# Patient Record
Sex: Male | Born: 1985 | Race: White | Hispanic: No | Smoking: Never smoker
Health system: Southern US, Community
[De-identification: ages and names within clinical notes are randomized; demographics above are authoritative.]

## PROBLEM LIST (undated history)

## (undated) HISTORY — PX: TYMPANOPLASTY: SHX33

---

## 2011-10-09 ENCOUNTER — Ambulatory Visit (INDEPENDENT_AMBULATORY_CARE_PROVIDER_SITE_OTHER): Payer: BC Managed Care – PPO | Admitting: Family Medicine

## 2011-10-09 VITALS — BP 132/90 | HR 108 | Temp 98.3°F | Resp 16 | Ht 69.0 in | Wt 224.0 lb

## 2011-10-09 DIAGNOSIS — R109 Unspecified abdominal pain: Secondary | ICD-10-CM

## 2011-10-09 LAB — POCT UA - MICROSCOPIC ONLY
Bacteria, U Microscopic: NEGATIVE
Casts, Ur, LPF, POC: NEGATIVE
Crystals, Ur, HPF, POC: NEGATIVE
Epithelial cells, urine per micros: NEGATIVE
Mucus, UA: NEGATIVE
RBC, urine, microscopic: NEGATIVE
WBC, Ur, HPF, POC: NEGATIVE
Yeast, UA: NEGATIVE

## 2011-10-09 LAB — POCT URINALYSIS DIPSTICK
Bilirubin, UA: NEGATIVE
Blood, UA: NEGATIVE
Glucose, UA: NEGATIVE
Ketones, UA: NEGATIVE
Leukocytes, UA: NEGATIVE
Nitrite, UA: NEGATIVE
Protein, UA: NEGATIVE
Spec Grav, UA: 1.025
Urobilinogen, UA: 0.2
pH, UA: 6

## 2011-10-09 NOTE — Progress Notes (Signed)
26 yo [employer] with acute right flank pain today, intermittent, starting about 3 pm.  Non-positional and not related to activity.  Longest duration is several minutes. No nausea or vomiting. No past hx of this.  O:  Alert, NAD, obese Denies pain now Abd:  Neg for tenderness, HSM, guarding, rebound, or mass No CVAT  Results for orders placed in visit on 10/09/11  POCT URINALYSIS DIPSTICK      Component Value Range   Color, UA yellow     Clarity, UA clear     Glucose, UA neg     Bilirubin, UA neg     Ketones, UA neg     Spec Grav, UA 1.025     Blood, UA neg     pH, UA 6.0     Protein, UA neg     Urobilinogen, UA 0.2     Nitrite, UA neg     Leukocytes, UA Negative    POCT UA - MICROSCOPIC ONLY      Component Value Range   WBC, Ur, HPF, POC neg     RBC, urine, microscopic neg     Bacteria, U Microscopic neg     Mucus, UA neg     Epithelial cells, urine per micros neg     Crystals, Ur, HPF, POC neg     Casts, Ur, LPF, POC neg     Yeast, UA neg     A:  Bowel related spasms are likely  P: probiotic, colace for now;  Take daily;  Improve diet Call if symptoms persist

## 2012-07-22 ENCOUNTER — Ambulatory Visit (INDEPENDENT_AMBULATORY_CARE_PROVIDER_SITE_OTHER): Payer: BC Managed Care – PPO | Admitting: Family Medicine

## 2012-07-22 DIAGNOSIS — Z043 Encounter for examination and observation following other accident: Secondary | ICD-10-CM

## 2012-07-22 NOTE — Patient Instructions (Addendum)
Take ibuprofen 800 mg (4 x 200mg  ) every 8 hours as needed for pain and inflammation.  Recheck if further concerns arise.
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
## 2015-04-14 ENCOUNTER — Inpatient Hospital Stay (HOSPITAL_COMMUNITY)
Admission: EM | Admit: 2015-04-14 | Discharge: 2015-04-19 | DRG: 871 | Disposition: A | Discharge status: Home / Self Care | Payer: BLUE CROSS/BLUE SHIELD | Attending: Internal Medicine | Admitting: Internal Medicine

## 2015-04-14 ENCOUNTER — Emergency Department (HOSPITAL_COMMUNITY): Payer: BLUE CROSS/BLUE SHIELD

## 2015-04-14 ENCOUNTER — Encounter (HOSPITAL_COMMUNITY): Payer: Self-pay | Admitting: Emergency Medicine

## 2015-04-14 DIAGNOSIS — R079 Chest pain, unspecified: Secondary | ICD-10-CM | POA: Diagnosis not present

## 2015-04-14 DIAGNOSIS — R17 Unspecified jaundice: Secondary | ICD-10-CM | POA: Diagnosis present

## 2015-04-14 DIAGNOSIS — Z6834 Body mass index (BMI) 34.0-34.9, adult: Secondary | ICD-10-CM | POA: Diagnosis not present

## 2015-04-14 DIAGNOSIS — D72829 Elevated white blood cell count, unspecified: Secondary | ICD-10-CM | POA: Diagnosis not present

## 2015-04-14 DIAGNOSIS — J189 Pneumonia, unspecified organism: Secondary | ICD-10-CM | POA: Diagnosis present

## 2015-04-14 DIAGNOSIS — R0902 Hypoxemia: Secondary | ICD-10-CM | POA: Diagnosis not present

## 2015-04-14 DIAGNOSIS — A419 Sepsis, unspecified organism: Principal | ICD-10-CM | POA: Diagnosis present

## 2015-04-14 DIAGNOSIS — J948 Other specified pleural conditions: Secondary | ICD-10-CM | POA: Diagnosis not present

## 2015-04-14 DIAGNOSIS — E669 Obesity, unspecified: Secondary | ICD-10-CM | POA: Diagnosis present

## 2015-04-14 DIAGNOSIS — J9601 Acute respiratory failure with hypoxia: Secondary | ICD-10-CM | POA: Diagnosis present

## 2015-04-14 DIAGNOSIS — R1011 Right upper quadrant pain: Secondary | ICD-10-CM | POA: Diagnosis present

## 2015-04-14 DIAGNOSIS — J918 Pleural effusion in other conditions classified elsewhere: Secondary | ICD-10-CM | POA: Diagnosis present

## 2015-04-14 DIAGNOSIS — R0781 Pleurodynia: Secondary | ICD-10-CM | POA: Diagnosis present

## 2015-04-14 DIAGNOSIS — J90 Pleural effusion, not elsewhere classified: Secondary | ICD-10-CM | POA: Diagnosis not present

## 2015-04-14 DIAGNOSIS — R000 Tachycardia, unspecified: Secondary | ICD-10-CM | POA: Diagnosis not present

## 2015-04-14 DIAGNOSIS — Z9889 Other specified postprocedural states: Secondary | ICD-10-CM

## 2015-04-14 DIAGNOSIS — R0600 Dyspnea, unspecified: Secondary | ICD-10-CM | POA: Diagnosis not present

## 2015-04-14 DIAGNOSIS — R0602 Shortness of breath: Secondary | ICD-10-CM

## 2015-04-14 LAB — COMPREHENSIVE METABOLIC PANEL
ALBUMIN: 4.8 g/dL (ref 3.5–5.0)
ALT: 27 U/L (ref 17–63)
AST: 20 U/L (ref 15–41)
Alkaline Phosphatase: 81 U/L (ref 38–126)
Anion gap: 11 (ref 5–15)
BILIRUBIN TOTAL: 2.5 mg/dL — AB (ref 0.3–1.2)
BUN: 14 mg/dL (ref 6–20)
CHLORIDE: 102 mmol/L (ref 101–111)
CO2: 26 mmol/L (ref 22–32)
Calcium: 9.7 mg/dL (ref 8.9–10.3)
Creatinine, Ser: 0.62 mg/dL (ref 0.61–1.24)
GFR calc Af Amer: >60 mL/min (ref >60)
GFR calc non Af Amer: >60 mL/min (ref >60)
GLUCOSE: 110 mg/dL — AB (ref 65–99)
POTASSIUM: 4 mmol/L (ref 3.5–5.1)
Sodium: 139 mmol/L (ref 135–145)
Total Protein: 8.6 g/dL — ABNORMAL HIGH (ref 6.5–8.1)

## 2015-04-14 LAB — CBC
HEMATOCRIT: 43 % (ref 39.0–52.0)
Hemoglobin: 15.2 g/dL (ref 13.0–17.0)
MCH: 30.9 pg (ref 26.0–34.0)
MCHC: 35.3 g/dL (ref 30.0–36.0)
MCV: 87.4 fL (ref 78.0–100.0)
Platelets: 249 10*3/uL (ref 150–400)
RBC: 4.92 MIL/uL (ref 4.22–5.81)
RDW: 12.1 % (ref 11.5–15.5)
WBC: 16.3 10*3/uL — ABNORMAL HIGH (ref 4.0–10.5)

## 2015-04-14 LAB — PROTIME-INR
INR: 1.11 (ref 0.00–1.49)
Prothrombin Time: 14.5 seconds (ref 11.6–15.2)

## 2015-04-14 LAB — PROCALCITONIN: Procalcitonin: <0.1 ng/mL

## 2015-04-14 LAB — LACTIC ACID, PLASMA: Lactic Acid, Venous: 1 mmol/L (ref 0.5–2.0)

## 2015-04-14 LAB — I-STAT CG4 LACTIC ACID, ED: LACTIC ACID, VENOUS: 1.16 mmol/L (ref 0.5–2.0)

## 2015-04-14 LAB — LIPASE, BLOOD: Lipase: 24 U/L (ref 11–51)

## 2015-04-14 LAB — MRSA PCR SCREENING: MRSA BY PCR: NEGATIVE

## 2015-04-14 LAB — APTT: APTT: 32 s (ref 24–37)

## 2015-04-14 MED ORDER — HYDROMORPHONE HCL 1 MG/ML IJ SOLN
1.0000 mg | Freq: Once | INTRAMUSCULAR | Status: AC
  Administered 2015-04-14: 1 mg via INTRAVENOUS
  Filled 2015-04-14: qty 1

## 2015-04-14 MED ORDER — HYDROMORPHONE HCL 1 MG/ML IJ SOLN
0.5000 mg | INTRAMUSCULAR | Status: DC | PRN
  Administered 2015-04-14 – 2015-04-15 (×7): 1 mg via INTRAVENOUS
  Administered 2015-04-15: 0.5 mg via INTRAVENOUS
  Filled 2015-04-14 (×8): qty 1

## 2015-04-14 MED ORDER — SODIUM CHLORIDE 0.9 % IV BOLUS (SEPSIS): 1000.0000 mL | Freq: Once | INTRAVENOUS | Status: DC

## 2015-04-14 MED ORDER — IOHEXOL 350 MG/ML SOLN
100.0000 mL | Freq: Once | INTRAVENOUS | Status: AC | PRN
Start: 2015-04-14 — End: 2015-04-14
  Administered 2015-04-14: 100 mL via INTRAVENOUS

## 2015-04-14 MED ORDER — SODIUM CHLORIDE 0.9 % IV SOLN
INTRAVENOUS | Status: DC
  Administered 2015-04-14 – 2015-04-16 (×5): via INTRAVENOUS

## 2015-04-14 MED ORDER — ACETAMINOPHEN 650 MG RE SUPP: 650.0000 mg | Freq: Four times a day (QID) | RECTAL | Status: DC | PRN

## 2015-04-14 MED ORDER — SODIUM CHLORIDE 0.9 % IV BOLUS (SEPSIS)
1000.0000 mL | Freq: Once | INTRAVENOUS | Status: AC
  Administered 2015-04-14: 1000 mL via INTRAVENOUS

## 2015-04-14 MED ORDER — SODIUM CHLORIDE 0.9 % IV SOLN: INTRAVENOUS | Status: DC

## 2015-04-14 MED ORDER — ALUM & MAG HYDROXIDE-SIMETH 200-200-20 MG/5ML PO SUSP: 30.0000 mL | Freq: Four times a day (QID) | ORAL | Status: DC | PRN

## 2015-04-14 MED ORDER — CEFTRIAXONE SODIUM 1 G IJ SOLR
1.0000 g | Freq: Once | INTRAMUSCULAR | Status: AC
  Administered 2015-04-14: 1 g via INTRAVENOUS
  Filled 2015-04-14: qty 10

## 2015-04-14 MED ORDER — ACETAMINOPHEN 325 MG PO TABS
650.0000 mg | ORAL_TABLET | Freq: Four times a day (QID) | ORAL | Status: DC | PRN
  Administered 2015-04-16: 650 mg via ORAL
  Filled 2015-04-14: qty 2

## 2015-04-14 MED ORDER — ENOXAPARIN SODIUM 100 MG/ML ~~LOC~~ SOLN
100.0000 mg | SUBCUTANEOUS | Status: AC
  Administered 2015-04-14: 100 mg via SUBCUTANEOUS
  Filled 2015-04-14: qty 1

## 2015-04-14 MED ORDER — DEXTROSE 5 % IV SOLN
500.0000 mg | Freq: Once | INTRAVENOUS | Status: AC
  Administered 2015-04-14: 500 mg via INTRAVENOUS
  Filled 2015-04-14: qty 500

## 2015-04-14 MED ORDER — ONDANSETRON HCL 4 MG/2ML IJ SOLN: 4.0000 mg | Freq: Four times a day (QID) | INTRAMUSCULAR | Status: DC | PRN

## 2015-04-14 MED ORDER — ONDANSETRON HCL 4 MG PO TABS: 4.0000 mg | ORAL_TABLET | Freq: Four times a day (QID) | ORAL | Status: DC | PRN

## 2015-04-14 MED ORDER — ENOXAPARIN SODIUM 100 MG/ML ~~LOC~~ SOLN
100.0000 mg | Freq: Two times a day (BID) | SUBCUTANEOUS | Status: DC
  Administered 2015-04-15 – 2015-04-17 (×5): 100 mg via SUBCUTANEOUS
  Filled 2015-04-14 (×6): qty 1

## 2015-04-14 MED ORDER — CEFTRIAXONE SODIUM 1 G IJ SOLR
1.0000 g | Freq: Every day | INTRAMUSCULAR | Status: DC
  Administered 2015-04-15 – 2015-04-16 (×2): 1 g via INTRAVENOUS
  Filled 2015-04-14 (×2): qty 10

## 2015-04-14 MED ORDER — OXYCODONE HCL 5 MG PO TABS
5.0000 mg | ORAL_TABLET | ORAL | Status: DC | PRN
  Administered 2015-04-14 – 2015-04-16 (×6): 5 mg via ORAL
  Filled 2015-04-14 (×6): qty 1

## 2015-04-14 MED ORDER — DEXTROSE 5 % IV SOLN
500.0000 mg | Freq: Every day | INTRAVENOUS | Status: DC
  Administered 2015-04-15 – 2015-04-18 (×4): 500 mg via INTRAVENOUS
  Filled 2015-04-14 (×4): qty 500

## 2015-04-14 MED ORDER — INFLUENZA VAC SPLIT QUAD 0.5 ML IM SUSY
0.5000 mL | PREFILLED_SYRINGE | INTRAMUSCULAR | Status: AC
  Administered 2015-04-19: 0.5 mL via INTRAMUSCULAR
  Filled 2015-04-14 (×3): qty 0.5

## 2015-04-14 NOTE — ED Notes (Signed)
Bed: WA13 Expected date:  Expected time:  Means of arrival:  Comments: Hold for Res B 

## 2015-04-14 NOTE — Progress Notes (Signed)
ANTIBIOTIC CONSULT NOTE - INITIAL  Pharmacy Consult for Azithromycin / Ceftriaxone Indication: Sepsis 2/2 CAP  No Known Allergies  Patient Measurements:   Adjusted Body Weight:   Vital Signs: Temp: 100.9 F (38.3 C) (11/19 1839) Temp Source: Oral (11/19 1839) BP: 120/64 mmHg (11/19 2005) Pulse Rate: 120 (11/19 2005) Intake/Output from previous day:   Intake/Output from this shift:    Labs:  Recent Labs  04/14/15 1646  WBC 16.3*  HGB 15.2  PLT 249  CREATININE 0.62   CrCl cannot be calculated (Unknown ideal weight.). No results for input(s): VANCOTROUGH, VANCOPEAK, VANCORANDOM, GENTTROUGH, GENTPEAK, GENTRANDOM, TOBRATROUGH, TOBRAPEAK, TOBRARND, AMIKACINPEAK, AMIKACINTROU, AMIKACIN in the last 72 hours.   Microbiology: No results found for this or any previous visit (from the past 720 hour(s)).  Medical History: History reviewed. No pertinent past medical history.  Assessment: [age] yoM admitted with tachycardia and right sided chest and abd pain. CTA had indeterminate results for possible PE and showed RLL airspace disease which may reflect atelectasis vs PNA.   Pharmacy consulted to start Azithromycin and Ceftriaxone for CAP.  No antibiotic allergies listed.  First doses given in ED.    Goal of Therapy:  Eradication of infection  Plan:  Continue Ceftriaxone 1g IV q24h and azithromycin 500mg  IV q24h Need for further dosage adjustment appears unlikely at present.    Will sign off at this time.  Please reconsult if a change in clinical status warrants re-evaluation of dosage.  Haynes Hoehnolleen Zahirah Cheslock, PharmD, BCPS 04/14/2015, 8:41 PM  Pager: [phone]

## 2015-04-14 NOTE — ED Notes (Signed)
Patient was given urinal, unable to go at this time. Has urinal at bedside and will try again.

## 2015-04-14 NOTE — H&P (Signed)
[facility] Admission History and Physical       Perrin Gens ZOX:[id number] DOB: [birth year] DOA: 04/14/2015  Referring physician: EDP PCP: Martha Clan, MD  Specialists:   Chief Complaint: Right Chest and RUQ Pain  HPI: Ryan Leach is a 29 y.o. male previously healthy until 4 days ago, he began to have fevers chills and Right Sided Chest Pain and RUQ Pain worse with a deep breath.   He also reports Cough and SOB, but denies any Nausea or Vomiting or Diarrhea.   He was evaluated in the ED and found to have Tachycardia, and a CTA of the Chest  Was performed and had indeterminate results for Possible PE however it did reveal a RLL infiltrate and he was started on IV Antibiotics for CAP Pneumonia.   A Sepsi Workup has also been inititated   Review of Systems:  Constitutional: No Weight Loss, No Weight Gain, Night Sweats, +Fevers, +Chills, Dizziness, Light Headedness, Fatigue, or Generalized Weakness HEENT: No Headaches, Difficulty Swallowing,Tooth/Dental Problems,Sore Throat,  No Sneezing, Rhinitis, Ear Ache, Nasal Congestion, or Post Nasal Drip,  Cardio-vascular:  +Chest pain, Orthopnea, PND, Edema in Lower Extremities, Anasarca, Dizziness, Palpitations  Resp: No Dyspnea, No DOE, No Productive Cough, +Non-Productive Cough, No Hemoptysis, No Wheezing.    GI: No Heartburn, Indigestion, Abdominal Pain, Nausea, Vomiting, Diarrhea, Constipation, Hematemesis, Hematochezia, Melena, Change in Bowel Habits,  Loss of Appetite  GU: No Dysuria, No Change in Color of Urine, No Urgency or Urinary Frequency, No Flank pain.  Musculoskeletal: No Joint Pain or Swelling, No Decreased Range of Motion, No Back Pain.  Neurologic: No Syncope, No Seizures, Muscle Weakness, Paresthesia, Vision Disturbance or Loss, No Diplopia, No Vertigo, No Difficulty Walking,  Skin: No Rash or Lesions. Psych: No Change in Mood or Affect, No Depression or Anxiety, No Memory loss, No Confusion, or  Hallucinations   History reviewed. No pertinent past medical history.   Past Surgical History  Procedure Laterality Date  . Tympanoplasty        Prior to Admission medications   Medication Sig Start Date End Date Taking? Authorizing Provider  Ibuprofen-Diphenhydramine HCl (ADVIL PM) 200-25 MG CAPS Take 2 capsules by mouth at bedtime as needed (pain.).   Yes Historical Provider, MD     No Known Allergies  Social History:  reports that he has never smoked. He does not have any smokeless tobacco history on file. He reports that he does not drink alcohol or use illicit drugs.    History reviewed. No pertinent family history.     Physical Exam:  GEN:  Pleasant Obese 29 y.o. Caucasian male examined and in no acute distress; cooperative with exam Filed Vitals:   04/14/15 1627 04/14/15 1754 04/14/15 1839 04/14/15 1842  BP: 132/91 138/88 130/84   Pulse: 141 131 132   Temp: 98.5 F (36.9 C)  100.9 F (38.3 C)   TempSrc: Oral  Oral   Resp: SpO2: 93% 93% 90% 95%   Blood pressure 130/84, pulse 132, temperature 100.9 F (38.3 C), temperature source Oral, resp. rate 30, SpO2 95 %. PSYCH: SHe is alert and oriented x4; does not appear anxious does not appear depressed; affect is normal HEENT: Normocephalic and Atraumatic, Mucous membranes pink; PERRLA; EOM intact; Fundi:  Benign;  No scleral icterus, Nares: Patent, Oropharynx: Clear, Fair Dentition,    Neck:  FROM, No Cervical Lymphadenopathy nor Thyromegaly or Carotid Bruit; No JVD; Breasts:: Not examined CHEST WALL: No tenderness CHEST: Normal respiration, clear to  auscultation bilaterally HEART: Regular rate and rhythm; no murmurs rubs or gallops BACK: No kyphosis or scoliosis; No CVA tenderness ABDOMEN: Positive Bowel Sounds, Obese, Soft Non-Tender, No Rebound or Guarding; No Masses, No Organomegaly. Rectal Exam: Not done EXTREMITIES: No Cyanosis, Clubbing, or Edema; No Ulcerations. Genitalia: not examined PULSES:  2+ and symmetric SKIN: Normal hydration no rash or ulceration CNS:  Alert and Oriented x 4, No Focal Deficits Vascular: pulses palpable throughout    Labs on Admission:  Basic Metabolic Panel:  Recent Labs Lab 04/14/15 1646  NA 139  K 4.0  CL 102  CO2 26  GLUCOSE 110*  BUN 14  CREATININE 0.62  CALCIUM 9.7   Liver Function Tests:  Recent Labs Lab 04/14/15 1646  AST 20  ALT 27  ALKPHOS 81  BILITOT 2.5*  PROT 8.6*  ALBUMIN 4.8    Recent Labs Lab 04/14/15 1646  LIPASE 24   No results for input(s): AMMONIA in the last 168 hours. CBC:  Recent Labs Lab 04/14/15 1646  WBC 16.3*  HGB 15.2  HCT 43.0  MCV 87.4  PLT 249   Cardiac Enzymes: No results for input(s): CKTOTAL, CKMB, CKMBINDEX, TROPONINI in the last 168 hours.  BNP (last 3 results) No results for input(s): BNP in the last 8760 hours.  ProBNP (last 3 results) No results for input(s): PROBNP in the last 8760 hours.  CBG: No results for input(s): GLUCAP in the last 168 hours.  Radiological Exams on Admission: Ct Angio Chest Pe W/cm &/or Wo Cm  04/14/2015  CLINICAL DATA:  Right-sided chest pain and abdominal pain EXAM: CT ANGIOGRAPHY CHEST WITH CONTRAST TECHNIQUE: Multidetector CT imaging of the chest was performed using the standard protocol during bolus administration of intravenous contrast. Multiplanar CT image reconstructions and MIPs were obtained to evaluate the vascular anatomy. CONTRAST:  OMNIPAQUE IOHEXOL 350 MG/ML SOLN COMPARISON:  None. FINDINGS: There is significant patient respiratory motion degrading image quality. The lobar and segmental pulmonary artery branches are are suboptimal for evaluation of pulmonary emboli. There is no central pulmonary embolus. The main pulmonary artery, right main pulmonary artery and left main pulmonary arteries are normal in size. The heart size is enlarged. There is no pericardial effusion. There is a small right pleural effusion. There is right lower  lobe airspace disease. There is mild right middle lobe atelectasis. There is no axillary, hilar, or mediastinal adenopathy. There is no lytic or blastic osseous lesion. The visualized portions of the upper abdomen are unremarkable. Review of the MIP images confirms the above findings. IMPRESSION: 1. Significant patient respiratory motion degrading image quality. The lobar and segmental pulmonary artery branches are are suboptimal for evaluation of pulmonary emboli. No central pulmonary embolus. 2. Small right pleural effusion with right lower lobe airspace disease which may reflect atelectasis versus pneumonia. Electronically Signed   By: Elige Ko   On: 04/14/2015 18:45   Dg Chest Portable 1 View  04/14/2015  CLINICAL DATA:  Pt complaining of RUQ abdominal pain on-going x 4 days. Worse with movement/pressure. "When I try to sleep it feels like I'm suffocating the pain is that bad." Denies N/V/D. Nonsmoker. EXAM: PORTABLE CHEST 1 VIEW COMPARISON:  None. FINDINGS: Mildly degraded exam due to AP portable technique and patient body habitus. Patient rotated to the left. Midline trachea. Cardiomegaly accentuated by AP portable technique. Possible small right pleural effusion. No pneumothorax. Extremely low lung volumes, accentuating the pulmonary interstitium. Suspect lower lobe predominant right sided airspace disease. Cannot exclude concurrent mild  pulmonary venous congestion. IMPRESSION: Suboptimal exam, secondary to patient size, AP portable technique, and likely minimal motion. Lower lobe predominant right-sided airspace disease is suspicious for infection. Atelectasis felt less likely. Cardiomegaly with suspicion of mild pulmonary venous congestion, superimposed upon extremely low lung volumes. Possible small right pleural effusion. Consider PA and lateral radiographs if possible. Electronically Signed   By: Jeronimo GreavesKyle  Talbot M.D.   On: 04/14/2015 17:15     EKG: Independently reviewed. Sinus Tachycardia rate=  135   Assessment/Plan:   29 y.o. male with  Active Problems:   1.    CAP (community acquired pneumonia)/ Early Sepsis   IV Rocephin   IV Azithromycin     2.    Sinus Tachycardia- due to #1, and Pain   IVFs   Pain Control PRN     3.    Pleuritic Chest Pain   CTA with Indeterminate results for PE    VQ Scan in AM     4.    RUQ ABD Pain    ABD US in AM   Monitor LFTs   Pain control PRN     5.    Leukocytosis- due to #1, and Stress Rxn   Monitor Trend      5.    DVT Prophylaxis   Full dose Lovenox until R/O by VQ scan    Code Status:     FULL CODE      Family Communication:   Parents  at Bedside     Disposition Plan:    Inpatient  Status        Time spent:   4970 Minutes      Ron ParkerJENKINS,Arvine Clayburn C [facility] Pager [phone]   If 7AM -7PM Please Contact the Day Rounding Team MD for [facility]  If 7PM-7AM, Please Contact Night-Floor Coverage  [email or web address] Password [email or web address] 04/14/2015, 7:47 PM     ADDENDUM:   Patient was seen and examined on 04/14/2015

## 2015-04-14 NOTE — ED Notes (Signed)
Dr. Rubin PayorPickering has been in and performed abd. U/s with our portable u/s machine.  We have just initiated IV access and sent blood for testing.

## 2015-04-14 NOTE — ED Notes (Signed)
Pt complaining of right sided abdominal pain on-going x 4 days. Worse with movement/pressure. "When I try to sleep it feels like I'm suffocating the pain is that bad." Denies N/V/D. Occasional fever/chills.

## 2015-04-14 NOTE — ED Provider Notes (Signed)
CSN: [id number]     Arrival date & time 04/14/15  1614 History   First MD Initiated Contact with Patient 04/14/15 1633     Chief Complaint  Patient presents with  . Tachycardia  . Abdominal Pain     (Consider location/radiation/quality/duration/timing/severity/associated sxs/prior Treatment) Patient is a 29 y.o. male presenting with abdominal pain. The history is provided by the patient.  Abdominal Pain  patient presents with right flank or right lower chest pain. It began 4 days ago. It is constant. Worse with lying down. Worse with certain movements. Not worse with eating. No dysuria. He has had some nausea. States he feels his heart going fast. No trauma. He has not had pains like this before. States it feels like he is suffocating when he lays back.  History reviewed. No pertinent past medical history. Past Surgical History  Procedure Laterality Date  . Tympanoplasty     History reviewed. No pertinent family history. Social History  Substance Use Topics  . Smoking status: Never Smoker   . Smokeless tobacco: None  . Alcohol Use: No    Review of Systems  Gastrointestinal: Positive for abdominal pain.      Allergies  Review of patient's allergies indicates no known allergies.  Home Medications   Prior to Admission medications   Medication Sig Start Date End Date Taking? Authorizing Provider  Ibuprofen-Diphenhydramine HCl (ADVIL PM) 200-25 MG CAPS Take 2 capsules by mouth at bedtime as needed (pain.).   Yes Historical Provider, MD   BP 120/64 mmHg  Pulse 120  Temp(Src) 100.9 F (38.3 C) (Oral)  Resp 24  Ht 5\' 9"  (1.753 m)  Wt 230 lb 6.1 oz (104.5 kg)  BMI 34.01 kg/m2  SpO2 96% Physical Exam  Constitutional: He appears well-developed.  HENT:  Head: Atraumatic.  Eyes: EOM are normal.  Neck: Neck supple.  Cardiovascular:  Tachycardia  Pulmonary/Chest: Effort normal.  Tachypnea  Abdominal: Soft. There is tenderness.  Right upper quadrant to right flank  tenderness. No rebound or guarding.  Musculoskeletal: Normal range of motion.  Neurological: He is alert.  Skin: Skin is warm.    ED Course  Procedures (including critical care time) Labs Review Labs Reviewed  COMPREHENSIVE METABOLIC PANEL - Abnormal; Notable for the following:    Glucose, Bld 110 (*)    Total Protein 8.6 (*)    Total Bilirubin 2.5 (*)    All other components within normal limits  CBC - Abnormal; Notable for the following:    WBC 16.3 (*)    All other components within normal limits  MRSA PCR SCREENING  CULTURE, BLOOD (ROUTINE X 2)  CULTURE, BLOOD (ROUTINE X 2)  LIPASE, BLOOD  PROTIME-INR  APTT  URINALYSIS, ROUTINE W REFLEX MICROSCOPIC (NOT [facility])  TSH  BASIC METABOLIC PANEL  CBC  LACTIC ACID, PLASMA  LACTIC ACID, PLASMA  PROCALCITONIN  I-STAT CG4 LACTIC ACID, ED    Imaging Review Ct Angio Chest Pe W/cm &/or Wo Cm  04/14/2015  CLINICAL DATA:  Right-sided chest pain and abdominal pain EXAM: CT ANGIOGRAPHY CHEST WITH CONTRAST TECHNIQUE: Multidetector CT imaging of the chest was performed using the standard protocol during bolus administration of intravenous contrast. Multiplanar CT image reconstructions and MIPs were obtained to evaluate the vascular anatomy. CONTRAST:  100mL OMNIPAQUE IOHEXOL 350 MG/ML SOLN COMPARISON:  None. FINDINGS: There is significant patient respiratory motion degrading image quality. The lobar and segmental pulmonary artery branches are are suboptimal for evaluation of pulmonary emboli. There is no central pulmonary  embolus. The main pulmonary artery, right main pulmonary artery and left main pulmonary arteries are normal in size. The heart size is enlarged. There is no pericardial effusion. There is a small right pleural effusion. There is right lower lobe airspace disease. There is mild right middle lobe atelectasis. There is no axillary, hilar, or mediastinal adenopathy. There is no lytic or blastic osseous lesion. The visualized  portions of the upper abdomen are unremarkable. Review of the MIP images confirms the above findings. IMPRESSION: 1. Significant patient respiratory motion degrading image quality. The lobar and segmental pulmonary artery branches are are suboptimal for evaluation of pulmonary emboli. No central pulmonary embolus. 2. Small right pleural effusion with right lower lobe airspace disease which may reflect atelectasis versus pneumonia. Electronically Signed   By: Elige Ko   On: 04/14/2015 18:45   Dg Chest Portable 1 View  04/14/2015  CLINICAL DATA:  Pt complaining of RUQ abdominal pain on-going x 4 days. Worse with movement/pressure. "When I try to sleep it feels like I'm suffocating the pain is that bad." Denies N/V/D. Nonsmoker. EXAM: PORTABLE CHEST 1 VIEW COMPARISON:  None. FINDINGS: Mildly degraded exam due to AP portable technique and patient body habitus. Patient rotated to the left. Midline trachea. Cardiomegaly accentuated by AP portable technique. Possible small right pleural effusion. No pneumothorax. Extremely low lung volumes, accentuating the pulmonary interstitium. Suspect lower lobe predominant right sided airspace disease. Cannot exclude concurrent mild pulmonary venous congestion. IMPRESSION: Suboptimal exam, secondary to patient size, AP portable technique, and likely minimal motion. Lower lobe predominant right-sided airspace disease is suspicious for infection. Atelectasis felt less likely. Cardiomegaly with suspicion of mild pulmonary venous congestion, superimposed upon extremely low lung volumes. Possible small right pleural effusion. Consider PA and lateral radiographs if possible. Electronically Signed   By: Jeronimo Greaves M.D.   On: 04/14/2015 17:15   I have personally reviewed and evaluated these images and lab results as part of my medical decision-making.   EKG Interpretation   Date/Time:  Saturday April 14 2015 16:39:11 EST Ventricular Rate:  135 PR Interval:  161 QRS  Duration: 89 QT Interval:  275 QTC Calculation: 412 R Axis:   130 Text Interpretation:  Sinus tachycardia Left posterior fascicular block  Baseline wander in lead(s) II V2 V3 V4 Confirmed by Rubin Payor  MD, Harrold Donath  [phone]) on 04/14/2015 10:51:51 PM      MDM   Final diagnoses:  CAP (community acquired pneumonia)    Patient with shortness of breath and chest pain. Has pneumonia on x-ray but worry for pulmonary embolism since has not been having fevers or coughing. CT scan done and was suboptimal for distal PE but no large central PEs. White count is elevated and he has developed low-grade fever. Will admit to stepdown bed. Has had IV Rocephin and azithromycin. Fluid boluses given.    Benjiman Core, MD 04/14/15 [phone]

## 2015-04-15 ENCOUNTER — Inpatient Hospital Stay (HOSPITAL_COMMUNITY): Payer: BLUE CROSS/BLUE SHIELD

## 2015-04-15 DIAGNOSIS — R1011 Right upper quadrant pain: Secondary | ICD-10-CM | POA: Insufficient documentation

## 2015-04-15 LAB — URINALYSIS, ROUTINE W REFLEX MICROSCOPIC
Bilirubin Urine: NEGATIVE
GLUCOSE, UA: NEGATIVE mg/dL
HGB URINE DIPSTICK: NEGATIVE
Ketones, ur: NEGATIVE mg/dL
LEUKOCYTES UA: NEGATIVE
Nitrite: NEGATIVE
PH: 5 (ref 5.0–8.0)
PROTEIN: NEGATIVE mg/dL

## 2015-04-15 LAB — BASIC METABOLIC PANEL
Anion gap: 7 (ref 5–15)
BUN: 15 mg/dL (ref 6–20)
CALCIUM: 8.4 mg/dL — AB (ref 8.9–10.3)
CO2: 25 mmol/L (ref 22–32)
CREATININE: 0.66 mg/dL (ref 0.61–1.24)
Chloride: 103 mmol/L (ref 101–111)
GFR calc Af Amer: >60 mL/min (ref >60)
GLUCOSE: 133 mg/dL — AB (ref 65–99)
POTASSIUM: 3.9 mmol/L (ref 3.5–5.1)
SODIUM: 135 mmol/L (ref 135–145)

## 2015-04-15 LAB — HEPATIC FUNCTION PANEL
ALT: 22 U/L (ref 17–63)
AST: 17 U/L (ref 15–41)
Albumin: 4 g/dL (ref 3.5–5.0)
Alkaline Phosphatase: 72 U/L (ref 38–126)
BILIRUBIN DIRECT: 0.3 mg/dL (ref 0.1–0.5)
BILIRUBIN INDIRECT: 1.7 mg/dL — AB (ref 0.3–0.9)
Total Bilirubin: 2 mg/dL — ABNORMAL HIGH (ref 0.3–1.2)
Total Protein: 7.6 g/dL (ref 6.5–8.1)

## 2015-04-15 LAB — CBC
HCT: 40.7 % (ref 39.0–52.0)
Hemoglobin: 14 g/dL (ref 13.0–17.0)
MCH: 30.6 pg (ref 26.0–34.0)
MCHC: 34.4 g/dL (ref 30.0–36.0)
MCV: 88.9 fL (ref 78.0–100.0)
PLATELETS: 217 10*3/uL (ref 150–400)
RBC: 4.58 MIL/uL (ref 4.22–5.81)
RDW: 12.2 % (ref 11.5–15.5)
WBC: 12.3 10*3/uL — AB (ref 4.0–10.5)

## 2015-04-15 LAB — D-DIMER, QUANTITATIVE (NOT AT ARMC): D DIMER QUANT: 0.46 ug{FEU}/mL (ref 0.00–0.50)

## 2015-04-15 LAB — LACTIC ACID, PLASMA: LACTIC ACID, VENOUS: 1.2 mmol/L (ref 0.5–2.0)

## 2015-04-15 LAB — TSH: TSH: 2.502 u[IU]/mL (ref 0.350–4.500)

## 2015-04-15 MED ORDER — LEVALBUTEROL HCL 0.63 MG/3ML IN NEBU
0.6300 mg | INHALATION_SOLUTION | Freq: Four times a day (QID) | RESPIRATORY_TRACT | Status: DC | PRN
  Administered 2015-04-15: 0.63 mg via RESPIRATORY_TRACT
  Filled 2015-04-15: qty 3

## 2015-04-15 MED ORDER — LEVALBUTEROL HCL 0.63 MG/3ML IN NEBU
0.6300 mg | INHALATION_SOLUTION | Freq: Four times a day (QID) | RESPIRATORY_TRACT | Status: DC
  Administered 2015-04-15 – 2015-04-16 (×6): 0.63 mg via RESPIRATORY_TRACT
  Filled 2015-04-15 (×6): qty 3

## 2015-04-15 MED ORDER — GUAIFENESIN ER 600 MG PO TB12
1200.0000 mg | ORAL_TABLET | Freq: Two times a day (BID) | ORAL | Status: DC
  Administered 2015-04-15 – 2015-04-19 (×9): 1200 mg via ORAL
  Filled 2015-04-15 (×10): qty 2

## 2015-04-15 MED ORDER — LORATADINE 10 MG PO TABS
10.0000 mg | ORAL_TABLET | Freq: Every day | ORAL | Status: DC
  Administered 2015-04-15 – 2015-04-19 (×5): 10 mg via ORAL
  Filled 2015-04-15 (×5): qty 1

## 2015-04-15 MED ORDER — CETYLPYRIDINIUM CHLORIDE 0.05 % MT LIQD
7.0000 mL | Freq: Two times a day (BID) | OROMUCOSAL | Status: DC
  Administered 2015-04-15 – 2015-04-19 (×7): 7 mL via OROMUCOSAL

## 2015-04-15 MED ORDER — LEVALBUTEROL HCL 0.63 MG/3ML IN NEBU: 0.6300 mg | INHALATION_SOLUTION | RESPIRATORY_TRACT | Status: DC | PRN

## 2015-04-15 NOTE — Progress Notes (Signed)
[facility] PROGRESS NOTE  Ryan Leach WUJ:[id number] DOB: 06-05-1985 DOA: 04/14/2015 PCP: Martha Clan, MD  Assessment/Plan: #1 sepsis secondary to community acquired pneumonia Patient presenting with sepsis meeting criteria with a heart rate as high as 141, respiratory rate of 40, with a leukocytosis of 16.3 in the temp of 100.9 on admission. Lactic acid was within normal limits. Pro-calcitonin was within normal limits. Chest x-ray and CT angiogram of the chest was concerning for right-sided pneumonia. Patient is currently afebrile. Leukocytosis is trending down. Blood cultures pending. Check a sputum Gram stain and culture. Check a urine Legionella antigen. Check a urine pneumococcus antigen. Continue empiric IV Rocephin, IV azithromycin. Place on Mucinex, scheduled nebulizers, Claritin. Follow.  #2 right upper quadrant pain/ pleuritic chest pain Likely secondary to community-acquired pneumonia. There was concern for possible PE and CT angiogram of the chest that was done was indeterminate. VQ scan has been ordered this morning. Abdominal ultrasound pending. Patient noted to have elevated bilirubin. Pain management. Continue empiric antibiotics. Follow.  #3 sinus tachycardia Likely secondary to problem #1. Continue IV fluids. Empiric IV antibiotics. Pain management.  #4 leukocytosis Secondary to problem #1. WBC trending down. She has been pancultured and cultures are pending. Check a sputum Gram stain and culture.  #5 hyperbilirubinemia Abdominal ultrasound pending. Repeat hepatic panel. Follow.  #6 prophylaxis Lovenox for DVT prophylaxis.  Code Status: Full Family Communication: Updated patient. No family at bedside. Disposition Plan: Remain in the stepdown unit.   Consultants:  None  Procedures:  CT angio of chest 04/14/2015.  Chest x-ray 04/14/2015  Antibiotics:  IV Rocephin 04/14/2015  IV azithromycin 04/14/2015  HPI/Subjective: Patient still complaining  of pleuritic chest pain on the right side. Patient states not feeling too well. Patient still with cough.  Objective: Filed Vitals:   04/15/15 0800  BP: 131/75  Pulse: 99  Temp: 97.9 F (36.6 C)  Resp: 20    Intake/Output Summary (Last 24 hours) at 04/15/15 0857 Last data filed at 04/15/15 0400  Gross per 24 hour  Intake    705 ml  Output    500 ml  Net    205 ml   Filed Weights   04/14/15 2030  Weight: 104.5 kg (230 lb 6.1 oz)    Exam:   General:  NAD  Cardiovascular: Tachycardic  Respiratory: Some coarse first sounds in the right base otherwise clear. No wheezing.  Abdomen: Soft, nontender, nondistended, positive bowel sounds.  Musculoskeletal: No clubbing cyanosis or edema.  Data Reviewed: Basic Metabolic Panel:  Recent Labs Lab 04/14/15 1646 04/15/15 0147  NA 139 135  K 4.0 3.9  CL 102 103  CO2 26 25  GLUCOSE 110* 133*  BUN 14 15  CREATININE 0.62 0.66  CALCIUM 9.7 8.4*   Liver Function Tests:  Recent Labs Lab 04/14/15 1646  AST 20  ALT 27  ALKPHOS 81  BILITOT 2.5*  PROT 8.6*  ALBUMIN 4.8    Recent Labs Lab 04/14/15 1646  LIPASE 24   No results for input(s): AMMONIA in the last 168 hours. CBC:  Recent Labs Lab 04/14/15 1646 04/15/15 0147  WBC 16.3* 12.3*  HGB 15.2 14.0  HCT 43.0 40.7  MCV 87.4 88.9  PLT 249 217   Cardiac Enzymes: No results for input(s): CKTOTAL, CKMB, CKMBINDEX, TROPONINI in the last 168 hours. BNP (last 3 results) No results for input(s): BNP in the last 8760 hours.  ProBNP (last 3 results) No results for input(s): PROBNP in the last 8760 hours.  CBG:  No results for input(s): GLUCAP in the last 168 hours.  Recent Results (from the past 240 hour(s))  MRSA PCR Screening     Status: None   Collection Time: 04/14/15  8:29 PM  Result Value Ref Range Status   MRSA by PCR NEGATIVE NEGATIVE Final    Comment:        The GeneXpert MRSA Assay (FDA approved for NASAL specimens only), is one component of  a comprehensive MRSA colonization surveillance program. It is not intended to diagnose MRSA infection nor to guide or monitor treatment for MRSA infections.      Studies: Ct Angio Chest Pe W/cm &/or Wo Cm  04/14/2015  CLINICAL DATA:  Right-sided chest pain and abdominal pain EXAM: CT ANGIOGRAPHY CHEST WITH CONTRAST TECHNIQUE: Multidetector CT imaging of the chest was performed using the standard protocol during bolus administration of intravenous contrast. Multiplanar CT image reconstructions and MIPs were obtained to evaluate the vascular anatomy. CONTRAST:  OMNIPAQUE IOHEXOL 350 MG/ML SOLN COMPARISON:  None. FINDINGS: There is significant patient respiratory motion degrading image quality. The lobar and segmental pulmonary artery branches are are suboptimal for evaluation of pulmonary emboli. There is no central pulmonary embolus. The main pulmonary artery, right main pulmonary artery and left main pulmonary arteries are normal in size. The heart size is enlarged. There is no pericardial effusion. There is a small right pleural effusion. There is right lower lobe airspace disease. There is mild right middle lobe atelectasis. There is no axillary, hilar, or mediastinal adenopathy. There is no lytic or blastic osseous lesion. The visualized portions of the upper abdomen are unremarkable. Review of the MIP images confirms the above findings. IMPRESSION: 1. Significant patient respiratory motion degrading image quality. The lobar and segmental pulmonary artery branches are are suboptimal for evaluation of pulmonary emboli. No central pulmonary embolus. 2. Small right pleural effusion with right lower lobe airspace disease which may reflect atelectasis versus pneumonia. Electronically Signed   By: Elige Ko   On: 04/14/2015 18:45   Dg Chest Portable 1 View  04/14/2015  CLINICAL DATA:  Pt complaining of RUQ abdominal pain on-going x 4 days. Worse with movement/pressure. "When I try to sleep it  feels like I'm suffocating the pain is that bad." Denies N/V/D. Nonsmoker. EXAM: PORTABLE CHEST 1 VIEW COMPARISON:  None. FINDINGS: Mildly degraded exam due to AP portable technique and patient body habitus. Patient rotated to the left. Midline trachea. Cardiomegaly accentuated by AP portable technique. Possible small right pleural effusion. No pneumothorax. Extremely low lung volumes, accentuating the pulmonary interstitium. Suspect lower lobe predominant right sided airspace disease. Cannot exclude concurrent mild pulmonary venous congestion. IMPRESSION: Suboptimal exam, secondary to patient size, AP portable technique, and likely minimal motion. Lower lobe predominant right-sided airspace disease is suspicious for infection. Atelectasis felt less likely. Cardiomegaly with suspicion of mild pulmonary venous congestion, superimposed upon extremely low lung volumes. Possible small right pleural effusion. Consider PA and lateral radiographs if possible. Electronically Signed   By: Jeronimo Greaves M.D.   On: 04/14/2015 17:15    Scheduled Meds: . antiseptic oral rinse  7 mL Mouth Rinse BID  . azithromycin  500 mg Intravenous QPC supper  . cefTRIAXone (ROCEPHIN)  IV  1 g Intravenous QPC supper  . enoxaparin (LOVENOX) injection  100 mg Subcutaneous Q12H  . Influenza vac split quadrivalent PF  0.5 mL Intramuscular Tomorrow-1000  . sodium chloride  1,000 mL Intravenous Once  . sodium chloride  1,000 mL Intravenous Once  Continuous Infusions: . sodium chloride 125 mL/hr at 04/15/15 0847    Principal Problem:   Sepsis (HCC) Active Problems:   CAP (community acquired pneumonia)   Sinus tachycardia (HCC)   Pleuritic chest pain   RUQ abdominal pain   Leukocytosis   Hyperbilirubinemia    Time spent: 40 minutes    Nezzie Manera M.D. [facility] Pager [phone]. If 7PM-7AM, please contact night-coverage at [email or web address], password [email or web address] 04/15/2015, 8:57 AM  LOS: 1 day

## 2015-04-15 NOTE — Procedures (Signed)
Pt refuses his 2 am treatment if asleep.

## 2015-04-16 ENCOUNTER — Inpatient Hospital Stay (HOSPITAL_COMMUNITY): Payer: BLUE CROSS/BLUE SHIELD

## 2015-04-16 DIAGNOSIS — J9601 Acute respiratory failure with hypoxia: Secondary | ICD-10-CM | POA: Clinically undetermined

## 2015-04-16 DIAGNOSIS — J90 Pleural effusion, not elsewhere classified: Secondary | ICD-10-CM

## 2015-04-16 DIAGNOSIS — R0902 Hypoxemia: Secondary | ICD-10-CM

## 2015-04-16 DIAGNOSIS — R079 Chest pain, unspecified: Secondary | ICD-10-CM

## 2015-04-16 DIAGNOSIS — J189 Pneumonia, unspecified organism: Secondary | ICD-10-CM

## 2015-04-16 DIAGNOSIS — R0600 Dyspnea, unspecified: Secondary | ICD-10-CM

## 2015-04-16 LAB — HEPATIC FUNCTION PANEL
ALK PHOS: 67 U/L (ref 38–126)
ALT: 27 U/L (ref 17–63)
AST: 22 U/L (ref 15–41)
Albumin: 3.5 g/dL (ref 3.5–5.0)
BILIRUBIN DIRECT: 0.2 mg/dL (ref 0.1–0.5)
BILIRUBIN INDIRECT: 0.9 mg/dL (ref 0.3–0.9)
BILIRUBIN TOTAL: 1.1 mg/dL (ref 0.3–1.2)
Total Protein: 6.8 g/dL (ref 6.5–8.1)

## 2015-04-16 LAB — BASIC METABOLIC PANEL
ANION GAP: 7 (ref 5–15)
BUN: 11 mg/dL (ref 6–20)
CHLORIDE: 102 mmol/L (ref 101–111)
CO2: 27 mmol/L (ref 22–32)
Calcium: 8.4 mg/dL — ABNORMAL LOW (ref 8.9–10.3)
Creatinine, Ser: 0.54 mg/dL — ABNORMAL LOW (ref 0.61–1.24)
GFR calc Af Amer: >60 mL/min (ref >60)
GLUCOSE: 116 mg/dL — AB (ref 65–99)
POTASSIUM: 3.8 mmol/L (ref 3.5–5.1)
Sodium: 136 mmol/L (ref 135–145)

## 2015-04-16 LAB — CBC WITH DIFFERENTIAL/PLATELET
BASOS ABS: 0 10*3/uL (ref 0.0–0.1)
Basophils Relative: 0 %
EOS PCT: 1 %
Eosinophils Absolute: 0.1 10*3/uL (ref 0.0–0.7)
HEMATOCRIT: 38.4 % — AB (ref 39.0–52.0)
HEMOGLOBIN: 12.9 g/dL — AB (ref 13.0–17.0)
LYMPHS ABS: 1.7 10*3/uL (ref 0.7–4.0)
LYMPHS PCT: 15 %
MCH: 30.1 pg (ref 26.0–34.0)
MCHC: 33.6 g/dL (ref 30.0–36.0)
MCV: 89.5 fL (ref 78.0–100.0)
Monocytes Absolute: 1.1 10*3/uL — ABNORMAL HIGH (ref 0.1–1.0)
Monocytes Relative: 10 %
NEUTROS ABS: 8.2 10*3/uL — AB (ref 1.7–7.7)
Neutrophils Relative %: 74 %
Platelets: 183 10*3/uL (ref 150–400)
RBC: 4.29 MIL/uL (ref 4.22–5.81)
RDW: 12.1 % (ref 11.5–15.5)
WBC: 11.1 10*3/uL — AB (ref 4.0–10.5)

## 2015-04-16 LAB — URINE CULTURE: CULTURE: NO GROWTH

## 2015-04-16 LAB — STREP PNEUMONIAE URINARY ANTIGEN: Strep Pneumo Urinary Antigen: NEGATIVE

## 2015-04-16 LAB — PROCALCITONIN: Procalcitonin: <0.1 ng/mL

## 2015-04-16 LAB — LACTATE DEHYDROGENASE: LDH: 156 U/L (ref 98–192)

## 2015-04-16 LAB — LACTIC ACID, PLASMA: Lactic Acid, Venous: 1.8 mmol/L (ref 0.5–2.0)

## 2015-04-16 LAB — HIV ANTIBODY (ROUTINE TESTING W REFLEX): HIV SCREEN 4TH GENERATION: NONREACTIVE

## 2015-04-16 MED ORDER — PANTOPRAZOLE SODIUM 40 MG PO TBEC
40.0000 mg | DELAYED_RELEASE_TABLET | Freq: Every day | ORAL | Status: DC
  Administered 2015-04-16 – 2015-04-19 (×4): 40 mg via ORAL
  Filled 2015-04-16 (×5): qty 1

## 2015-04-16 MED ORDER — METOPROLOL TARTRATE 1 MG/ML IV SOLN
5.0000 mg | Freq: Once | INTRAVENOUS | Status: AC
  Administered 2015-04-16: 5 mg via INTRAVENOUS
  Filled 2015-04-16: qty 5

## 2015-04-16 MED ORDER — PIPERACILLIN-TAZOBACTAM 3.375 G IVPB
3.3750 g | Freq: Three times a day (TID) | INTRAVENOUS | Status: DC
  Administered 2015-04-16 – 2015-04-19 (×8): 3.375 g via INTRAVENOUS
  Filled 2015-04-16 (×9): qty 50

## 2015-04-16 MED ORDER — IBUPROFEN 800 MG PO TABS
800.0000 mg | ORAL_TABLET | Freq: Three times a day (TID) | ORAL | Status: AC
  Administered 2015-04-16 – 2015-04-18 (×9): 800 mg via ORAL
  Filled 2015-04-16 (×9): qty 1

## 2015-04-16 MED ORDER — LEVALBUTEROL HCL 0.63 MG/3ML IN NEBU
0.6300 mg | INHALATION_SOLUTION | Freq: Three times a day (TID) | RESPIRATORY_TRACT | Status: DC
  Administered 2015-04-17 – 2015-04-18 (×4): 0.63 mg via RESPIRATORY_TRACT
  Filled 2015-04-16 (×4): qty 3

## 2015-04-16 MED ORDER — KETOROLAC TROMETHAMINE 30 MG/ML IJ SOLN: 30.0000 mg | Freq: Four times a day (QID) | INTRAMUSCULAR | Status: DC | PRN

## 2015-04-16 MED ORDER — FUROSEMIDE 10 MG/ML IJ SOLN: 40.0000 mg | Freq: Once | INTRAMUSCULAR | Status: DC

## 2015-04-16 NOTE — Progress Notes (Signed)
ANTIBIOTIC CONSULT NOTE - INITIAL  Pharmacy Consult for Zosyn Indication: pneumonia  No Known Allergies  Patient Measurements: Height: 5\' 9"  (175.3 cm) Weight: 230 lb 6.1 oz (104.5 kg) IBW/kg (Calculated) : 70.7  Vital Signs: Temp: 100.6 F (38.1 C) (11/21 1800) Temp Source: Oral (11/21 1600) BP: 149/86 mmHg (11/21 1756) Pulse Rate: 138 (11/21 1800) Intake/Output from previous day: 11/20 0701 - 11/21 0700 In: 3255.4 [I.V.:2955.4; IV Piggyback:300] Out: 1750 [Urine:1750] Intake/Output from this shift: Total I/O In: 1295 [I.V.:1055; Other:240] Out: -   Labs:  Recent Labs  04/14/15 1646 04/15/15 0147 04/16/15 0645  WBC 16.3* 12.3* 11.1*  HGB 15.2 14.0 12.9*  PLT 249 217 183  CREATININE 0.62 0.66 0.54*   Estimated Creatinine Clearance: 162.3 mL/min (by C-G formula based on Cr of 0.54). No results for input(s): VANCOTROUGH, VANCOPEAK, VANCORANDOM, GENTTROUGH, GENTPEAK, GENTRANDOM, TOBRATROUGH, TOBRAPEAK, TOBRARND, AMIKACINPEAK, AMIKACINTROU, AMIKACIN in the last 72 hours.   Microbiology: Recent Results (from the past 720 hour(s))  Culture, blood (routine x 2)     Status: None (Preliminary result)   Collection Time: 04/14/15  7:35 PM  Result Value Ref Range Status   Specimen Description BLOOD LEFT HAND  Final   Special Requests IN PEDIATRIC BOTTLE 1CC  Final   Culture   Final    NO GROWTH 2 DAYS [facility]    Report Status PENDING  Incomplete  Culture, blood (routine x 2)     Status: None (Preliminary result)   Collection Time: 04/14/15  7:50 PM  Result Value Ref Range Status   Specimen Description BLOOD RIGHT HAND  Final   Special Requests BOTTLES DRAWN AEROBIC AND ANAEROBIC 5ML  Final   Culture   Final    NO GROWTH 2 DAYS [facility]formed at [facility]    Report Status PENDING  Incomplete  MRSA PCR Screening     Status: None   Collection Time: 04/14/15  8:29 PM  Result Value Ref Range Status   MRSA by PCR NEGATIVE NEGATIVE Final     Comment:        The GeneXpert MRSA Assay (FDA approved for NASAL specimens only), is one component of a comprehensive MRSA colonization surveillance program. It is not intended to diagnose MRSA infection nor to guide or monitor treatment for MRSA infections.   Culture, Urine     Status: None   Collection Time: 04/15/15  3:25 AM  Result Value Ref Range Status   Specimen Description URINE, CLEAN CATCH  Final   Special Requests NONE  Final   Culture   Final    NO GROWTH 1 DAY [facility]     Report Status 04/16/2015 FINAL  Final   Medical History: History reviewed. No pertinent past medical history.  Medications:  Scheduled:  . antiseptic oral rinse  7 mL Mouth Rinse BID  . azithromycin  500 mg Intravenous QPC supper  . enoxaparin (LOVENOX) injection  100 mg Subcutaneous Q12H  . guaiFENesin  1,200 mg Oral BID  . ibuprofen  800 mg Oral TID  . Influenza vac split quadrivalent PF  0.5 mL Intramuscular Tomorrow-1000  . levalbuterol  0.63 mg Nebulization Q6H  . loratadine  10 mg Oral Daily  . metoprolol  5 mg Intravenous Once  . pantoprazole  40 mg Oral Q0600  . piperacillin-tazobactam (ZOSYN)  IV  3.375 g Intravenous Q8H  . sodium chloride  1,000 mL Intravenous Once  . sodium chloride  1,000 mL Intravenous Once  Anti-infectives    Start     Dose/Rate Route Frequency Ordered Stop   04/16/15 2000  piperacillin-tazobactam (ZOSYN) IVPB 3.375 g     3.375 g 12.5 mL/hr over 240 Minutes Intravenous Every 8 hours 04/16/15 1838     04/15/15 1800  cefTRIAXone (ROCEPHIN) 1 g in dextrose 5 % 50 mL IVPB  Status:  Discontinued     1 g 100 mL/hr over 30 Minutes Intravenous Daily after supper 04/14/15 2028 04/16/15 1833   04/15/15 1800  azithromycin (ZITHROMAX) 500 mg in dextrose 5 % 250 mL IVPB     500 mg 250 mL/hr over 60 Minutes Intravenous Daily after supper 04/14/15 2028     04/14/15 1900  azithromycin (ZITHROMAX) 500 mg in dextrose 5 % 250 mL IVPB     500  mg 250 mL/hr over 60 Minutes Intravenous  Once 04/14/15 1851 04/14/15 2002   04/14/15 1730  cefTRIAXone (ROCEPHIN) 1 g in dextrose 5 % 50 mL IVPB     1 g 100 mL/hr over 30 Minutes Intravenous  Once 04/14/15 1726 04/14/15 1821     Assessment: 29 yoM admitted with tachycardia and right sided chest and abd pain. CTA had indeterminate results for possible PE and showed RLL airspace disease which may reflect atelectasis vs PNA.   Pharmacy consulted to start Azithromycin and Ceftriaxone for CAP 11/19, Ceftriazone changed to Zosyn 11/21, remains febrile.   Goal of Therapy:  Treatment of infection, appropriate antibiotic, dose/schedule appropriate for renal function  Plan:   Zosyn 3.375gm q8hr, 4 hr infusion  No change to Azithromycin  Otho Bellows PharmD Pager [phone] 04/16/2015, 6:47 PM

## 2015-04-16 NOTE — Progress Notes (Signed)
[facility] PROGRESS NOTE  Ryan Leach BJY:[id number] DOB: [birth year] DOA: 04/14/2015 PCP: Martha Clan, MD  Assessment/Plan: #1 sepsis secondary to community acquired pneumonia/CAP Patient presenting with sepsis meeting criteria with a heart rate as high as 141, respiratory rate of 40, with a leukocytosis of 16.3 in the temp of 100.9 on admission. Lactic acid was within normal limits. Pro-calcitonin was within normal limits. Chest x-ray and CT angiogram of the chest was concerning for right-sided pneumonia. Patient is currently afebrile. Leukocytosis is trending down. Blood cultures pending. Sputum Gram stain and culture pending. Urine streptococcus antigen is negative. Urine Legionella antigen pending. Continue empiric IV Rocephin, IV azithromycin. Place on Mucinex, scheduled nebulizers, Claritin.  pulmonary toilet. Mobilize. Follow.  #2 right upper quadrant pain/ pleuritic chest pain Likely secondary to community-acquired pneumonia. There was concern for possible PE and CT angiogram of the chest that was done was indeterminate. VQ scan was ordered however patient was unable to remain still. Abdominal ultrasound unremarkable. D-dimer is negative. Will DC VQ scan. Hyperbilirubinemia has resolved. Continue pain management, IV antibiotics, pulmonary toilet, mobilize. Follow.  #3 sinus tachycardia Likely secondary to problem #1. Continue IV fluids. Empiric IV antibiotics. Pain management.  #4 leukocytosis Secondary to problem #1. WBC trending down. Patient  has been pancultured and cultures are pending.  sputum Gram stain and cultures pending. Continue empiric IV antibiotics.   #5 hyperbilirubinemia Abdominal ultrasound unremarkable. Hyperbilirubinemia resolved. Follow.  #6 prophylaxis Lovenox for DVT prophylaxis.  Code Status: Full Family Communication: Updated patient. No family at bedside. Updated parents yesterday. Disposition Plan: Remain in the stepdown unit. Probably transfer  to regular/telemetry floor tomorrow.   Consultants:  None  Procedures:  CT angio of chest 04/14/2015.  Chest x-ray 04/14/2015  Abdominal ultrasound 04/15/2015  Antibiotics:  IV Rocephin 04/14/2015  IV azithromycin 04/14/2015  HPI/Subjective: Patient states feeling a little better today. Pleuritic pain improving. Shortness of breath improving.  Objective: Filed Vitals:   04/16/15 0732 04/16/15 0800  BP:  126/81  Pulse:  103  Temp: 99 F (37.2 C)   Resp:  22    Intake/Output Summary (Last 24 hours) at 04/16/15 0865 Last data filed at 04/16/15 0800  Gross per 24 hour  Intake   3175 ml  Output   1750 ml  Net   1425 ml   Filed Weights   04/14/15 2030  Weight: 104.5 kg (230 lb 6.1 oz)    Exam:   General:  NAD  Cardiovascular: Tachycardic  Respiratory: Some coarse breath sounds in the right base otherwise clear. No wheezing.  Abdomen: Soft, nontender, nondistended, positive bowel sounds.  Musculoskeletal: No clubbing cyanosis or edema.  Data Reviewed: Basic Metabolic Panel:  Recent Labs Lab 04/14/15 1646 04/15/15 0147 04/16/15 0645  NA 139 135 136  K 4.0 3.9 3.8  CL 102 103 102  CO2 GLUCOSE 110* 133* 116*  BUN CREATININE 0.62 0.66 0.54*  CALCIUM 9.7 8.4* 8.4*   Liver Function Tests:  Recent Labs Lab 04/14/15 1646 04/15/15 0851 04/16/15 0645  AST ALT ALKPHOS 81 72 67  BILITOT 2.5* 2.0* 1.1  PROT 8.6* 7.6 6.8  ALBUMIN 4.8 4.0 3.5    Recent Labs Lab 04/14/15 1646  LIPASE 24   No results for input(s): AMMONIA in the last 168 hours. CBC:  Recent Labs Lab 04/14/15 1646 04/15/15 0147 04/16/15 0645  WBC 16.3* 12.3* 11.1*  NEUTROABS  --   --  8.2*  HGB 15.2 14.0 12.9*  HCT 43.0 40.7 38.4*  MCV 87.4 88.9 89.5  PLT 249 217 183   Cardiac Enzymes: No results for input(s): CKTOTAL, CKMB, CKMBINDEX, TROPONINI in the last 168 hours. BNP (last 3 results) No results for input(s): BNP in  the last 8760 hours.  ProBNP (last 3 results) No results for input(s): PROBNP in the last 8760 hours.  CBG: No results for input(s): GLUCAP in the last 168 hours.  Recent Results (from the past 240 hour(s))  Culture, blood (routine x 2)     Status: None (Preliminary result)   Collection Time: 04/14/15  7:35 PM  Result Value Ref Range Status   Specimen Description BLOOD LEFT HAND  Final   Special Requests IN PEDIATRIC BOTTLE 1CC  Final   Culture   Final    NO GROWTH < 24 HOURS Performed at [facility]    Report Status PENDING  Incomplete  Culture, blood (routine x 2)     Status: None (Preliminary result)   Collection Time: 04/14/15  7:50 PM  Result Value Ref Range Status   Specimen Description BLOOD RIGHT HAND  Final   Special Requests BOTTLES DRAWN AEROBIC AND ANAEROBIC  Final   Culture   Final    NO GROWTH < 24 HOURS Performed at [facility]    Report Status PENDING  Incomplete  MRSA PCR Screening     Status: None   Collection Time: 04/14/15  8:29 PM  Result Value Ref Range Status   MRSA by PCR NEGATIVE NEGATIVE Final    Comment:        The GeneXpert MRSA Assay (FDA approved for NASAL specimens only), is one component of a comprehensive MRSA colonization surveillance program. It is not intended to diagnose MRSA infection nor to guide or monitor treatment for MRSA infections.      Studies: Ct Angio Chest Pe W/cm &/or Wo Cm  04/14/2015  CLINICAL DATA:  Right-sided chest pain and abdominal pain EXAM: CT ANGIOGRAPHY CHEST WITH CONTRAST TECHNIQUE: Multidetector CT imaging of the chest was performed using the standard protocol during bolus administration of intravenous contrast. Multiplanar CT image reconstructions and MIPs were obtained to evaluate the vascular anatomy. CONTRAST:  OMNIPAQUE IOHEXOL 350 MG/ML SOLN COMPARISON:  None. FINDINGS: There is significant patient respiratory motion degrading image quality. The lobar and segmental pulmonary  artery branches are are suboptimal for evaluation of pulmonary emboli. There is no central pulmonary embolus. The main pulmonary artery, right main pulmonary artery and left main pulmonary arteries are normal in size. The heart size is enlarged. There is no pericardial effusion. There is a small right pleural effusion. There is right lower lobe airspace disease. There is mild right middle lobe atelectasis. There is no axillary, hilar, or mediastinal adenopathy. There is no lytic or blastic osseous lesion. The visualized portions of the upper abdomen are unremarkable. Review of the MIP images confirms the above findings. IMPRESSION: 1. Significant patient respiratory motion degrading image quality. The lobar and segmental pulmonary artery branches are are suboptimal for evaluation of pulmonary emboli. No central pulmonary embolus. 2. Small right pleural effusion with right lower lobe airspace disease which may reflect atelectasis versus pneumonia. Electronically Signed   By: Elige Ko   On: 04/14/2015 18:45   US Abdomen Complete  04/15/2015  CLINICAL DATA:  Patient with right upper quadrant pain for 6 days. EXAM: ULTRASOUND ABDOMEN COMPLETE COMPARISON:  None. FINDINGS: Gallbladder: No gallstones or wall thickening visualized.  No sonographic Murphy sign noted. Common bile duct: Diameter: 3 mm Liver: No focal lesion identified. Within normal limits in parenchymal echogenicity. IVC: No abnormality visualized. Pancreas: Visualized portion unremarkable. Spleen: Size and appearance within normal limits. Right Kidney: Length: 12.3 cm. Echogenicity within normal limits. No mass or hydronephrosis visualized. Left Kidney: Length: 14.1 cm. Echogenicity within normal limits. No mass or hydronephrosis visualized. Abdominal aorta: Not visualized due to overlying bowel gas. Other findings: None. IMPRESSION: No cholelithiasis or sonographic evidence for acute cholecystitis. No hydronephrosis. Electronically Signed   By: Annia Beltrew   Davis M.D.   On: 04/15/2015 10:50   Dg Chest Portable 1 View  04/14/2015  CLINICAL DATA:  Pt complaining of RUQ abdominal pain on-going x 4 days. Worse with movement/pressure. "When I try to sleep it feels like I'm suffocating the pain is that bad." Denies N/V/D. Nonsmoker. EXAM: PORTABLE CHEST 1 VIEW COMPARISON:  None. FINDINGS: Mildly degraded exam due to AP portable technique and patient body habitus. Patient rotated to the left. Midline trachea. Cardiomegaly accentuated by AP portable technique. Possible small right pleural effusion. No pneumothorax. Extremely low lung volumes, accentuating the pulmonary interstitium. Suspect lower lobe predominant right sided airspace disease. Cannot exclude concurrent mild pulmonary venous congestion. IMPRESSION: Suboptimal exam, secondary to patient size, AP portable technique, and likely minimal motion. Lower lobe predominant right-sided airspace disease is suspicious for infection. Atelectasis felt less likely. Cardiomegaly with suspicion of mild pulmonary venous congestion, superimposed upon extremely low lung volumes. Possible small right pleural effusion. Consider PA and lateral radiographs if possible. Electronically Signed   By: Jeronimo GreavesKyle  Talbot M.D.   On: 04/14/2015 17:15    Scheduled Meds: . antiseptic oral rinse  7 mL Mouth Rinse BID  . azithromycin  500 mg Intravenous QPC supper  . cefTRIAXone (ROCEPHIN)  IV  1 g Intravenous QPC supper  . enoxaparin (LOVENOX) injection  100 mg Subcutaneous Q12H  . guaiFENesin  1,200 mg Oral BID  . ibuprofen  800 mg Oral TID  . Influenza vac split quadrivalent PF  0.5 mL Intramuscular Tomorrow-1000  . levalbuterol  0.63 mg Nebulization Q6H  . loratadine  10 mg Oral Daily  . pantoprazole  40 mg Oral Q0600  . sodium chloride  1,000 mL Intravenous Once  . sodium chloride  1,000 mL Intravenous Once   Continuous Infusions: . sodium chloride 125 mL/hr at 04/16/15 [id number]    Principal Problem:   Sepsis (HCC) Active  Problems:   CAP (community acquired pneumonia)   Sinus tachycardia (HCC)   Pleuritic chest pain   RUQ abdominal pain   Leukocytosis   Hyperbilirubinemia   RUQ pain    Time spent: 40 minutes    Maymuna Detzel M.D. [facility] Pager [phone]. If 7PM-7AM, please contact night-coverage at [email or web address], password [facility] 04/16/2015, 9:04 AM  LOS: 2 days

## 2015-04-16 NOTE — Consult Note (Signed)
Name: Ryan KaplanRobert Leach MRN: [id number] DOB: Apr 04, 1986    ADMISSION DATE:  04/14/2015 CONSULTATION DATE:  04/16/2015  REFERRING MD :  TRH  CHIEF COMPLAINT:  SOB, CP and PNA.  BRIEF PATIENT DESCRIPTION: 29 year old with no significant PMH who presents to the hospital with SOB and CP.  Fever and chills 6 days ago followed by cough without sputum production.  Pain is worse with laying flat, coughing or breathing deeply.  Reports feeling having to stent that side when walking as well but denies abdominal pain per se.  No other positive symptoms with 12 point ROS.  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  No previous infections reported, no medications and no allergies.  Life long non-smoker and non-drinker.  No drug abuse.  SIGNIFICANT EVENTS  11/19 admission for SOB and chest pain.  STUDIES:  11/19 CT with some pleural effusion and RLL infiltrate.  HISTORY OF PRESENT ILLNESS:  29 year old with no significant PMH who presents to the hospital with SOB and CP.  Fever and chills 6 days ago followed by cough without sputum production.  Pain is worse with laying flat, coughing or breathing deeply.  Reports feeling having to stent that side when walking as well but denies abdominal pain per se.  No other positive symptoms with 12 point ROS.  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  No previous infections reported, no medications and no allergies.  Life long non-smoker and non-drinker.  No drug abuse.  PAST MEDICAL HISTORY :   has no past medical history on file.  has past surgical history that includes Tympanoplasty. Prior to Admission medications   Medication Sig Start Date End Date Taking? Authorizing Provider  Ibuprofen-Diphenhydramine HCl (ADVIL PM) 200-25 MG CAPS Take 2 capsules by mouth at bedtime as needed (pain.).   Yes Historical Provider, MD   No Known Allergies  FAMILY HISTORY:   family history is not on file. SOCIAL HISTORY:  reports that he has never smoked. He does not have any smokeless tobacco history on file. He reports that he does not drink alcohol or use illicit drugs.  REVIEW OF SYSTEMS:   Constitutional: Negative for fever, chills, weight loss, malaise/fatigue and diaphoresis.  HENT: Negative for hearing loss, ear pain, nosebleeds, congestion, sore throat, neck pain, tinnitus and ear discharge.   Eyes: Negative for blurred vision, double vision, photophobia, pain, discharge and redness.  Respiratory: Negative for cough, hemoptysis, sputum production, shortness of breath, wheezing and stridor.   Cardiovascular: Negative for chest pain, palpitations, orthopnea, claudication, leg swelling and PND.  Gastrointestinal: Negative for heartburn, nausea, vomiting, abdominal pain, diarrhea, constipation, blood in stool and melena.  Genitourinary: Negative for dysuria, urgency, frequency, hematuria and flank pain.  Musculoskeletal: Negative for myalgias, back pain, joint pain and falls.  Skin: Negative for itching and rash.  Neurological: Negative for dizziness, tingling, tremors, sensory change, speech change, focal weakness, seizures, loss of consciousness, weakness and headaches.  Endo/Heme/Allergies: Negative for environmental allergies and polydipsia. Does not bruise/bleed easily.  SUBJECTIVE: Right sided chest pain, positional and improving.  VITAL SIGNS: Temp:  [98.2 F (36.8 C)-100.6 F (38.1 C)] 100.6 F (38.1 C) (11/21 1800) Pulse Rate:  [101-138] 111 (11/21 2200) Resp:  [19-37] 25 (11/21 2200) BP: (114-158)/(65-90) 119/65 mmHg (11/21 2200) SpO2:  [86 %-98 %] 96 % (11/21 2200)  PHYSICAL EXAMINATION: General:  Well  appearing, NAD. Neuro:  Awake and interactive, moving all ext to commands. HEENT:  [place]/AT, PERRL, EOM-I and MMM. Cardiovascular:  RRR, Nl S1/S2, -M/R/G. Lungs:  Decreased BS at the bases R>L.  Minimal dullness to percussion on the  R>L. Abdomen:  Soft, NT, ND and +BS. Musculoskeletal:  -edema and -tenderness. Skin:  Intact.   Recent Labs Lab 04/14/15 1646 04/15/15 0147 04/16/15 0645  NA 139 135 136  K 4.0 3.9 3.8  CL 102 103 102  CO2 BUN CREATININE 0.62 0.66 0.54*  GLUCOSE 110* 133* 116*    Recent Labs Lab 04/14/15 1646 04/15/15 0147 04/16/15 0645  HGB 15.2 14.0 12.9*  HCT 43.0 40.7 38.4*  WBC 16.3* 12.3* 11.1*  PLT 249 217 183   US Abdomen Complete  04/15/2015  CLINICAL DATA:  Patient with right upper quadrant pain for 6 days. EXAM: ULTRASOUND ABDOMEN COMPLETE COMPARISON:  None. FINDINGS: Gallbladder: No gallstones or wall thickening visualized. No sonographic Murphy sign noted. Common bile duct: Diameter: 3 mm Liver: No focal lesion identified. Within normal limits in parenchymal echogenicity. IVC: No abnormality visualized. Pancreas: Visualized portion unremarkable. Spleen: Size and appearance within normal limits. Right Kidney: Length: 12.3 cm. Echogenicity within normal limits. No mass or hydronephrosis visualized. Left Kidney: Length: 14.1 cm. Echogenicity within normal limits. No mass or hydronephrosis visualized. Abdominal aorta: Not visualized due to overlying bowel gas. Other findings: None. IMPRESSION: No cholelithiasis or sonographic evidence for acute cholecystitis. No hydronephrosis. Electronically Signed   By: Annia Belt M.D.   On: 04/15/2015 10:50   Dg Chest Port 1 View  04/16/2015  CLINICAL DATA:  29 year old male with shortness of breath, cough, congestion and weakness for the past 7 days EXAM: PORTABLE CHEST 1 VIEW COMPARISON:  Prior chest x-ray and CT scan of the chest 04/14/2015 FINDINGS: Stable cardiac and mediastinal contours. Persistent layering right-sided pleural effusion and associated right basilar atelectasis versus infiltrate. Left lower lobe opacity is also similar compared to prior and favored to represent atelectasis. No pulmonary edema or pneumothorax.  Overall, the volume of the pleural effusion may be slightly increased compared to prior. No acute osseous abnormality. IMPRESSION: 1. Similar to slightly increased layering right pleural effusion and associated right lower lobe atelectasis and/or infiltrate. 2. Similar degree of left lower lobe atelectasis. Electronically Signed   By: Malachy Moan M.D.   On: 04/16/2015 17:55   I reviewed chest CT myself, R pleural effusion and dependent atelectasis.  ASSESSMENT / PLAN:  29 year old male with no significant PMH who presents to the hospital with RLL PNA, pleural effusion, hypoxemia and chest pain.  Discussed with bedside nurse and PCCM-PA.  RLL infiltrate: likely PNA, likely community acquired.  - Zithromax as ordered.  - F/U on cultures.  - Zosyn as ordered.  - Will likely need more time with abx.  Pleural effusion: concern for empyema, performed bedside U/S, not enough to warrant at thora safely at this point.  - CXR in AM.  - If effusion worsens will perform thoracentesis.  - Continue abx for now.  Hypoxemia: due to atelectasis and PNA.  - Supplemental O2 for sat of 88-92%.  - IS per RT protocol.  Chest pain: likely pleurisy.  - NSAID.  - Continue abx.  PCCM will follow, if pleural effusion worsens then will reconsider thora.  Alyson Reedy, M.D. [facility] Pulmonary/Critical Care Medicine. Pager: [phone]. After hours pager: [phone].  04/16/2015, 10:22 PM

## 2015-04-16 NOTE — Progress Notes (Signed)
Was called by nursing patient tachycardic with heart rates in the 130s blood systolic blood pressure in the 150s and patient noted to be clammy. Patient noted to also be hypoxic. Came by floor and assessed the patient. Gen.: Patient sitting in the chair clammy looking and warm to the touch. Respiration: Decreased breath sounds in the right base. No wheezing. No crackles. Cardiovascular: Tachycardic Abdomen: Mildly distended, positive bowel sounds, nontender, no rebound, no guarding Extremities: No clubbing cyanosis or edema.  Assessment/plan #1 acute hypoxic respiratory distress Likely secondary to right pleural effusion noted on chest x-ray consent for parapneumonic effusion as patient is being treated for sepsis secondary to right sided community-acquired pneumonia. Patient has been placed on the Ventimask. Repeat blood cultures 2. Check a lactic acid. Check a pro-calcitonin. Check LDH. Have spoken with critical care medicine for further evaluation and management and for therapeutic and diagnostic thoracentesis. Will discontinue Rocephin and place empirically on IV Zosyn. Continue azithromycin. Follow.  #2 right pleural effusion Per chest x-ray. Patient also being treated for right lower lobe pneumonia. Consent for parapneumonic effusion. Patient is clammy, low-grade temperature 100.9, tachycardic with heart rate in the 130s, O2 sats in the mid 80s on nasal cannula 6 L. Patient has been placed on the Ventimask. Check LDH. Consult with critical care medicine for diagnostic and therapeutic thoracentesis and further evaluation and management.

## 2015-04-17 ENCOUNTER — Inpatient Hospital Stay (HOSPITAL_COMMUNITY): Payer: BLUE CROSS/BLUE SHIELD

## 2015-04-17 DIAGNOSIS — J9601 Acute respiratory failure with hypoxia: Secondary | ICD-10-CM

## 2015-04-17 DIAGNOSIS — J948 Other specified pleural conditions: Secondary | ICD-10-CM

## 2015-04-17 LAB — GRAM STAIN

## 2015-04-17 LAB — PROTEIN, BODY FLUID: TOTAL PROTEIN, FLUID: 4.2 g/dL

## 2015-04-17 LAB — BASIC METABOLIC PANEL
ANION GAP: 6 (ref 5–15)
BUN: 14 mg/dL (ref 6–20)
CALCIUM: 8.7 mg/dL — AB (ref 8.9–10.3)
CO2: 29 mmol/L (ref 22–32)
CREATININE: 0.82 mg/dL (ref 0.61–1.24)
Chloride: 107 mmol/L (ref 101–111)
Glucose, Bld: 108 mg/dL — ABNORMAL HIGH (ref 65–99)
Potassium: 3.8 mmol/L (ref 3.5–5.1)
SODIUM: 142 mmol/L (ref 135–145)

## 2015-04-17 LAB — BODY FLUID CELL COUNT WITH DIFFERENTIAL
EOS FL: 1 %
LYMPHS FL: 26 %
MONOCYTE-MACROPHAGE-SEROUS FLUID: 50 % (ref 50–90)
Neutrophil Count, Fluid: 23 % (ref 0–25)
WBC FLUID: 4377 uL — AB (ref 0–1000)

## 2015-04-17 LAB — CBC
HCT: 36.2 % — ABNORMAL LOW (ref 39.0–52.0)
Hemoglobin: 11.9 g/dL — ABNORMAL LOW (ref 13.0–17.0)
MCH: 29.8 pg (ref 26.0–34.0)
MCHC: 32.9 g/dL (ref 30.0–36.0)
MCV: 90.7 fL (ref 78.0–100.0)
PLATELETS: 190 10*3/uL (ref 150–400)
RBC: 3.99 MIL/uL — ABNORMAL LOW (ref 4.22–5.81)
RDW: 12.1 % (ref 11.5–15.5)
WBC: 7.3 10*3/uL (ref 4.0–10.5)

## 2015-04-17 LAB — LEGIONELLA PNEUMOPHILA SEROGP 1 UR AG: L. PNEUMOPHILA SEROGP 1 UR AG: NEGATIVE

## 2015-04-17 LAB — LACTATE DEHYDROGENASE, PLEURAL OR PERITONEAL FLUID: LD, Fluid: 190 U/L — ABNORMAL HIGH (ref 3–23)

## 2015-04-17 LAB — GLUCOSE, SEROUS FLUID: GLUCOSE FL: 101 mg/dL

## 2015-04-17 MED ORDER — ENOXAPARIN SODIUM 30 MG/0.3ML ~~LOC~~ SOLN: 30.0000 mg | SUBCUTANEOUS | Status: DC

## 2015-04-17 MED ORDER — ENOXAPARIN SODIUM 40 MG/0.4ML ~~LOC~~ SOLN
40.0000 mg | SUBCUTANEOUS | Status: DC
  Administered 2015-04-18: 40 mg via SUBCUTANEOUS
  Filled 2015-04-17 (×2): qty 0.4

## 2015-04-17 NOTE — Clinical Documentation Improvement (Signed)
Hospitalist  Can the diagnosis of Respiratory Failure be further specified?   Document Acuity - Acute, Chronic, Acute on Chronic  Document Inclusion Of - Hypoxia, Hypercapnia, Combination of Both  Other  Clinically Undetermined  Document any associated diagnoses/conditions.   Supporting Information: Acute hypoxic respiratory distress, secondary to right pleural effusion noted on chest x-ray consent for parapneumonic effusion as patient is being treated for sepsis secondary to right sided community-acquired pneumonia. Patient has been placed on the Ventimask per 11/21 progress notes.   Please exercise your independent, professional judgment when responding. A specific answer is not anticipated or expected.   Thank Sabino DonovanYou,  [facility] [facility] [employer] [phone]

## 2015-04-17 NOTE — Procedures (Signed)
US guided diagnostic/therapeutic right thoracentesis performed yielding 600 cc turbid, blood-tinged fluid. The fluid was sent to the lab for preordered studies. F/u CXR pending. No immediate complications.

## 2015-04-17 NOTE — Progress Notes (Signed)
Name: Ryan KaplanRobert Leach MRN: [id number] DOB: 11/25/1985    ADMISSION DATE:  04/14/2015 CONSULTATION DATE:  04/16/2015  REFERRING MD :  TRH  CHIEF COMPLAINT:  SOB, CP and PNA.  BRIEF PATIENT DESCRIPTION: 29 year old with no significant PMH who presents to the hospital with SOB and CP.  Fever and chills 6 days ago followed by cough without sputum production.  Pain is worse with laying flat, coughing or breathing deeply.  Reports feeling having to stent that side when walking as well but denies abdominal pain per se.  No other positive symptoms with 12 point ROS.  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  No previous infections reported, no medications and no allergies.  Life long non-smoker and non-drinker.  No drug abuse.  SIGNIFICANT EVENTS  11/19 admission for SOB and chest pain.  STUDIES:  11/19 CT with some pleural effusion and RLL infiltrate.    SUBJECTIVE:  Pt reports "feeling a little better than on admit".  Denies fevers / chills, sweats, sputum production   VITAL SIGNS: Temp:  [98 F (36.7 C)-100.6 F (38.1 C)] 98.6 F (37 C) (11/22 0800) Pulse Rate:  [81-138] 111 (11/22 1100) Resp:  [18-37] 23 (11/22 1100) BP: (108-158)/(64-96) 156/96 mmHg (11/22 1000) SpO2:  [89 %-98 %] 93 % (11/22 1100)  PHYSICAL EXAMINATION: General:  Well appearing, NAD. Neuro:  Awake and interactive, moving all ext to commands. HEENT:  [place]/AT, PERRL, EOM-I and MMM. Cardiovascular:  RRR, Nl S1/S2, -M/R/G. Lungs:  Decreased BS at the bases R>L.  Bronchial breath sounds on R lower,  Minimal dullness to percussion on the R>L. Egophony on R Abdomen:  Soft, NT, ND and +BS. Musculoskeletal:  -edema and -tenderness. Skin:  Intact, no rashes or lesions   Recent Labs Lab 04/15/15 0147 04/16/15 0645 04/17/15 0340  NA 135 136 142  K 3.9 3.8 3.8  CL 103 102 107  CO2 25 27 29   BUN 15 11 14   CREATININE 0.66 0.54* 0.82  GLUCOSE 133* 116* 108*     Recent Labs Lab 04/15/15 0147 04/16/15 0645 04/17/15 0340  HGB 14.0 12.9* 11.9*  HCT 40.7 38.4* 36.2*  WBC 12.3* 11.1* 7.3  PLT 217 183 190   Dg Chest Port 1 View  04/17/2015  CLINICAL DATA:  Right pleural effusion. EXAM: PORTABLE CHEST 1 VIEW COMPARISON:  04/16/2015. FINDINGS: Mediastinum hilar structures are normal. Right perihilar/right lower lobe atelectasis and/or infiltrate with right pleural effusion again noted. Left lung is clear. Cardiomegaly with normal pulmonary vascularity. No acute bony abnormality . IMPRESSION: 1. Persistent right perihilar/right lower lobe atelectasis and/or infiltrate and right pleural effusion. 2. Persistent cardiomegaly.  No pulmonary venous congestion. Electronically Signed   By: Maisie Fushomas  Register   On: 04/17/2015 07:21   Dg Chest Port 1 View  04/16/2015  CLINICAL DATA:  29 year old male with shortness of breath, cough, congestion and weakness for the past 7 days EXAM: PORTABLE CHEST 1 VIEW COMPARISON:  Prior chest x-ray and CT scan of the chest 04/14/2015 FINDINGS: Stable cardiac and mediastinal contours. Persistent layering right-sided pleural effusion and associated right basilar atelectasis versus infiltrate. Left lower lobe opacity is also similar compared to prior and favored to represent atelectasis. No pulmonary edema or pneumothorax. Overall, the volume of the pleural effusion may be slightly increased compared to prior. No acute osseous abnormality. IMPRESSION: 1. Similar to slightly increased layering right pleural effusion and associated right lower lobe atelectasis and/or  infiltrate. 2. Similar degree of left lower lobe atelectasis. Electronically Signed   By: Malachy Moan M.D.   On: 04/16/2015 17:55     ASSESSMENT / PLAN:  Discussion:  29 year old male with no significant PMH who presents to the hospital with RLL PNA, pleural effusion, hypoxemia and chest pain.   RLL infiltrate: likely PNA, likely community acquired.  - Zithromax  as ordered.  - F/U on cultures.  - Zosyn as ordered.  - Will likely need more time with abx.  Pleural effusion: concern for empyema, performed bedside U/S pm 11/21, not enough to warrant at thora safely at this point.  - Trend CXR (note am CXR 11/22 is rotated giving the false appearance of worsening).  - IR to assess to review pleural space   - Continue abx for now.  Hypoxemia: due to atelectasis and PNA.  - Supplemental O2 for sat of 88-92%.  - IS per RT protocol.  Chest pain: likely pleurisy.  - NSAID.  - Continue abx.  PCCM will follow, if pleural effusion worsens then will reconsider thora.    Canary Brim, NP-C [place] [facility] Pgr: [phone] or if no answer [phone] 04/17/2015, 11:43 AM

## 2015-04-17 NOTE — Progress Notes (Signed)
[facility] PROGRESS NOTE  Ryan KaplanRobert Leach ZOX:[id number] DOB: 13-Dec-1985 DOA: 04/14/2015 PCP: Martha ClanShaw, William, MD  Brief interval history Ryan Leach is a 29 y.o. male previously healthy until 4 days ago, he began to have fevers chills and Right Sided Chest Pain and RUQ Pain worse with a deep breath. He also reports Cough and SOB, but denies any Nausea or Vomiting or Diarrhea. He was evaluated in the ED and found to have Tachycardia, and a CTA of the Chest Was performed and had indeterminate results for Possible PE however it did reveal a RLL infiltrate and he was started on IV Antibiotics for CAP Pneumonia. A Sepsi Workup has also been inititated Patient was admitted to SDU and treated for sepsis secondary to community-acquired pneumonia. Patient also noted to have some pleurisy. Initial concern for PE however patient could not lay still for exam to be done. D-dimer was checked which was negative. Patient was noted to go into little bit of respiratory distress on the night of 04/16/2015. Chest x-ray done revealed a right pleural effusion. Concern for parapneumonic versus empyema. Critical care was consulted patient was assessed and not enough fluid noted at bedside ultrasound to 100 thoracentesis. IV antibiotics was broadened to Zosyn and azithromycin. Patient was maintained on supportive care. Repeat chest x-ray in the morning to assess pleural effusion.    Assessment/Plan: #1 sepsis secondary to community acquired pneumonia/CAP Patient presenting with sepsis meeting criteria with a heart rate as high as 141, respiratory rate of 40, with a leukocytosis of 16.3 in the temp of 100.9 on admission. Lactic acid was within normal limits. Pro-calcitonin was within normal limits. Chest x-ray and CT angiogram of the chest was concerning for right-sided pneumonia. Patient is currently afebrile. Leukocytosis is trending down. Blood cultures pending. Sputum Gram stain and culture pending. Urine  streptococcus antigen is negative. Urine Legionella antigen pending. Continue empiric IV azithromycin. IV Rocephin was changed yesterday to IV Zosyn. Continue Mucinex, scheduled nebulizers, Claritin.  pulmonary toilet. Mobilize. Follow.  #2 acute respiratory failure with hypoxia Likely secondary to community-acquired pneumonia and right-sided pleural effusion. Some clinical improvement. Patient had to be placed on Ventimask overnight secondary to respiratory status. IV Rocephin was changed to IV Zosyn. Chest x-ray done yesterday and this morning show a right-sided pleural effusion. Patient was assessed by critical care and by ultrasound not enough fluid to be drawn. Continue empiric IV antibiotics, oxygen, nebulizer treatments, Mucinex, Claritin, pulmonary toilet. Follow-up chest x-ray in the morning. Critical care following and appreciate input and recommendations.  #3 right-sided pleural effusion Concern for parapneumonic effusion versus empyema as patient is noted to have a right lower lobe pneumonia. Patient was assessed by critical care last night and ultrasound was done at the bedside which did not show enough fluid to warrant thoracentesis safely per critical care note. Repeat chest x-ray in the morning. If effusion worsens patient will likely need a thoracentesis. Continue IV Zosyn and IV azithromycin. Critical care following.  #4 right upper quadrant pain/ pleuritic chest pain/pleurisy Likely secondary to community-acquired pneumonia and pleural effusion. There was concern for possible PE and CT angiogram of the chest that was done was indeterminate. VQ scan was ordered however patient was unable to remain still. Abdominal ultrasound unremarkable. D-dimer is negative and a such pulmonary embolism is highly unlikely. DC VQ scan. Hyperbilirubinemia has resolved. Continue pain management, IV antibiotics, pulmonary toilet, mobilize. Follow.  #5 sinus tachycardia Likely secondary to problem #1.  Improved. DC IV fluids. Continue empiric IV antibiotics. Continue  IV fluids. Pain management.  #6 leukocytosis Secondary to problem #1. WBC trending down. Patient  has been pancultured and cultures are pending.  sputum Gram stain and cultures pending. Continue IV azithromycin. IV Rocephin was changed to IV Zosyn yesterday.   #7 hyperbilirubinemia Abdominal ultrasound unremarkable. Hyperbilirubinemia resolved. Follow.  #8 prophylaxis Lovenox for DVT prophylaxis.  Code Status: Full Family Communication: Updated patient. No family at bedside.  Disposition Plan: Remain in the stepdown unit.   Consultants:  PCCM: Dr Molli Knock 04/16/2015  Procedures:  CT angio of chest 04/14/2015.  Chest x-ray 04/14/2015, 04/16/2015, 04/17/2015  Abdominal ultrasound 04/15/2015  Antibiotics:  IV Rocephin 04/14/2015>>>>> 04/16/2015  IV azithromycin 04/14/2015  IV Zosyn 04/16/2015  HPI/Subjective: Patient sitting up in bed. States some improvement with shortness of breath. Some improvement with right-sided pain.  Objective: Filed Vitals:   04/17/15 0700 04/17/15 0800  BP:  123/76  Pulse: 97 91  Temp:  98.6 F (37 C)  Resp: 21 20    Intake/Output Summary (Last 24 hours) at 04/17/15 0945 Last data filed at 04/17/15 0844  Gross per 24 hour  Intake   3185 ml  Output      0 ml  Net   3185 ml   Filed Weights   04/14/15 2030  Weight: 104.5 kg (230 lb 6.1 oz)    Exam:   General:  NAD  Cardiovascular: Tachycardia  Respiratory: Decreased breath sounds in the right base otherwise clear. No wheezing.  Abdomen: Soft, nontender, nondistended, positive bowel sounds.  Musculoskeletal: No clubbing cyanosis or edema.  Data Reviewed: Basic Metabolic Panel:  Recent Labs Lab 04/14/15 1646 04/15/15 0147 04/16/15 0645 04/17/15 0340  NA 139 135 136 142  K 4.0 3.9 3.8 3.8  CL 102 103 102 107  CO2 GLUCOSE 110* 133* 116* 108*  BUN CREATININE 0.62 0.66 0.54*  0.82  CALCIUM 9.7 8.4* 8.4* 8.7*   Liver Function Tests:  Recent Labs Lab 04/14/15 1646 04/15/15 0851 04/16/15 0645  AST ALT ALKPHOS 81 72 67  BILITOT 2.5* 2.0* 1.1  PROT 8.6* 7.6 6.8  ALBUMIN 4.8 4.0 3.5    Recent Labs Lab 04/14/15 1646  LIPASE 24   No results for input(s): AMMONIA in the last 168 hours. CBC:  Recent Labs Lab 04/14/15 1646 04/15/15 0147 04/16/15 0645 04/17/15 0340  WBC 16.3* 12.3* 11.1* 7.3  NEUTROABS  --   --  8.2*  --   HGB 15.2 14.0 12.9* 11.9*  HCT 43.0 40.7 38.4* 36.2*  MCV 87.4 88.9 89.5 90.7  PLT 249 217 183 190   Cardiac Enzymes: No results for input(s): CKTOTAL, CKMB, CKMBINDEX, TROPONINI in the last 168 hours. BNP (last 3 results) No results for input(s): BNP in the last 8760 hours.  ProBNP (last 3 results) No results for input(s): PROBNP in the last 8760 hours.  CBG: No results for input(s): GLUCAP in the last 168 hours.  Recent Results (from the past 240 hour(s))  Culture, blood (routine x 2)     Status: None (Preliminary result)   Collection Time: 04/14/15  7:35 PM  Result Value Ref Range Status   Specimen Description BLOOD LEFT HAND  Final   Special Requests IN PEDIATRIC BOTTLE 1CC  Final   Culture   Final    NO GROWTH 2 DAYS Performed at [facility]    Report Status PENDING  Incomplete  Culture, blood (routine x  2)     Status: None (Preliminary result)   Collection Time: 04/14/15  7:50 PM  Result Value Ref Range Status   Specimen Description BLOOD RIGHT HAND  Final   Special Requests BOTTLES DRAWN AEROBIC AND ANAEROBIC  Final   Culture   Final    NO GROWTH 2 DAYS Performed at [facility]    Report Status PENDING  Incomplete  MRSA PCR Screening     Status: None   Collection Time: 04/14/15  8:29 PM  Result Value Ref Range Status   MRSA by PCR NEGATIVE NEGATIVE Final    Comment:        The GeneXpert MRSA Assay (FDA approved for NASAL specimens only), is one component of  a comprehensive MRSA colonization surveillance program. It is not intended to diagnose MRSA infection nor to guide or monitor treatment for MRSA infections.   Culture, Urine     Status: None   Collection Time: 04/15/15  3:25 AM  Result Value Ref Range Status   Specimen Description URINE, CLEAN CATCH  Final   Special Requests NONE  Final   Culture   Final    NO GROWTH 1 DAY Performed at [facility]    Report Status 04/16/2015 FINAL  Final     Studies: US Abdomen Complete  04/15/2015  CLINICAL DATA:  Patient with right upper quadrant pain for 6 days. EXAM: ULTRASOUND ABDOMEN COMPLETE COMPARISON:  None. FINDINGS: Gallbladder: No gallstones or wall thickening visualized. No sonographic Murphy sign noted. Common bile duct: Diameter: 3 mm Liver: No focal lesion identified. Within normal limits in parenchymal echogenicity. IVC: No abnormality visualized. Pancreas: Visualized portion unremarkable. Spleen: Size and appearance within normal limits. Right Kidney: Length: 12.3 cm. Echogenicity within normal limits. No mass or hydronephrosis visualized. Left Kidney: Length: 14.1 cm. Echogenicity within normal limits. No mass or hydronephrosis visualized. Abdominal aorta: Not visualized due to overlying bowel gas. Other findings: None. IMPRESSION: No cholelithiasis or sonographic evidence for acute cholecystitis. No hydronephrosis. Electronically Signed   By: Annia Belt M.D.   On: 04/15/2015 10:50   Dg Chest Port 1 View  04/17/2015  CLINICAL DATA:  Right pleural effusion. EXAM: PORTABLE CHEST 1 VIEW COMPARISON:  04/16/2015. FINDINGS: Mediastinum hilar structures are normal. Right perihilar/right lower lobe atelectasis and/or infiltrate with right pleural effusion again noted. Left lung is clear. Cardiomegaly with normal pulmonary vascularity. No acute bony abnormality . IMPRESSION: 1. Persistent right perihilar/right lower lobe atelectasis and/or infiltrate and right pleural effusion. 2.  Persistent cardiomegaly.  No pulmonary venous congestion. Electronically Signed   By: Maisie Fus  Register   On: 04/17/2015 07:21   Dg Chest Port 1 View  04/16/2015  CLINICAL DATA:  29 year old male with shortness of breath, cough, congestion and weakness for the past 7 days EXAM: PORTABLE CHEST 1 VIEW COMPARISON:  Prior chest x-ray and CT scan of the chest 04/14/2015 FINDINGS: Stable cardiac and mediastinal contours. Persistent layering right-sided pleural effusion and associated right basilar atelectasis versus infiltrate. Left lower lobe opacity is also similar compared to prior and favored to represent atelectasis. No pulmonary edema or pneumothorax. Overall, the volume of the pleural effusion may be slightly increased compared to prior. No acute osseous abnormality. IMPRESSION: 1. Similar to slightly increased layering right pleural effusion and associated right lower lobe atelectasis and/or infiltrate. 2. Similar degree of left lower lobe atelectasis. Electronically Signed   By: Malachy Moan M.D.   On: 04/16/2015 17:55    Scheduled Meds: . antiseptic oral  rinse  7 mL Mouth Rinse BID  . azithromycin  500 mg Intravenous QPC supper  . enoxaparin (LOVENOX) injection  100 mg Subcutaneous Q12H  . guaiFENesin  1,200 mg Oral BID  . ibuprofen  800 mg Oral TID  . Influenza vac split quadrivalent PF  0.5 mL Intramuscular Tomorrow-1000  . levalbuterol  0.63 mg Nebulization TID  . loratadine  10 mg Oral Daily  . pantoprazole  40 mg Oral Q0600  . piperacillin-tazobactam (ZOSYN)  IV  3.375 g Intravenous Q8H  . sodium chloride  1,000 mL Intravenous Once  . sodium chloride  1,000 mL Intravenous Once   Continuous Infusions:    Principal Problem:   Sepsis (HCC) Active Problems:   CAP (community acquired pneumonia)   Pleural effusion on right   Acute respiratory failure with hypoxia (HCC)   Sinus tachycardia (HCC)   Pleuritic chest pain   RUQ abdominal pain   Leukocytosis   Hyperbilirubinemia    RUQ pain    Time spent: 40 minutes    Mohannad Olivero M.D. [facility] Pager [phone]. If 7PM-7AM, please contact night-coverage at [email or web address], password [facility] 04/17/2015, 9:45 AM  LOS: 3 days

## 2015-04-18 ENCOUNTER — Inpatient Hospital Stay (HOSPITAL_COMMUNITY): Payer: BLUE CROSS/BLUE SHIELD

## 2015-04-18 ENCOUNTER — Encounter (HOSPITAL_COMMUNITY): Payer: BLUE CROSS/BLUE SHIELD

## 2015-04-18 DIAGNOSIS — Z9889 Other specified postprocedural states: Secondary | ICD-10-CM | POA: Insufficient documentation

## 2015-04-18 LAB — CBC
HEMATOCRIT: 35.9 % — AB (ref 39.0–52.0)
HEMOGLOBIN: 12.5 g/dL — AB (ref 13.0–17.0)
MCH: 31.3 pg (ref 26.0–34.0)
MCHC: 34.8 g/dL (ref 30.0–36.0)
MCV: 89.8 fL (ref 78.0–100.0)
PLATELETS: 214 10*3/uL (ref 150–400)
RBC: 4 MIL/uL — AB (ref 4.22–5.81)
RDW: 12 % (ref 11.5–15.5)
WBC: 7.7 10*3/uL (ref 4.0–10.5)

## 2015-04-18 LAB — PH, BODY FLUID: pH, Body Fluid: 7.4

## 2015-04-18 LAB — COMPREHENSIVE METABOLIC PANEL
ALK PHOS: 62 U/L (ref 38–126)
ALT: 44 U/L (ref 17–63)
AST: 29 U/L (ref 15–41)
Albumin: 3 g/dL — ABNORMAL LOW (ref 3.5–5.0)
Anion gap: 7 (ref 5–15)
BILIRUBIN TOTAL: 0.6 mg/dL (ref 0.3–1.2)
BUN: 11 mg/dL (ref 6–20)
CHLORIDE: 105 mmol/L (ref 101–111)
CO2: 27 mmol/L (ref 22–32)
CREATININE: 0.57 mg/dL — AB (ref 0.61–1.24)
Calcium: 9 mg/dL (ref 8.9–10.3)
GFR calc Af Amer: >60 mL/min (ref >60)
Glucose, Bld: 105 mg/dL — ABNORMAL HIGH (ref 65–99)
Potassium: 3.8 mmol/L (ref 3.5–5.1)
Sodium: 139 mmol/L (ref 135–145)
Total Protein: 6.4 g/dL — ABNORMAL LOW (ref 6.5–8.1)

## 2015-04-18 LAB — AMYLASE, PLEURAL FLUID: Amylase, Pleural Fluid: 9 U/L

## 2015-04-18 LAB — PROCALCITONIN: Procalcitonin: <0.1 ng/mL

## 2015-04-18 NOTE — Progress Notes (Signed)
Name: Ryan KaplanRobert Leach MRN: [id number] DOB: 10/14/1985    ADMISSION DATE:  04/14/2015 CONSULTATION DATE:  04/16/2015  REFERRING MD :  [facility]  CHIEF COMPLAINT:  SOB, CP and PNA.  BRIEF PATIENT DESCRIPTION: 29 year old never smoker with no significant PMH presented 11/19 with SOB, chest pain, fevers, chills and cough x 6 days.  Admitted by [facility] with CAP.  PCCM consulted for pleural effusion.   SIGNIFICANT EVENTS  11/19 admission for SOB and chest pain. 11/22>> IR thora - 600ml turbid, blood tinged fluid   STUDIES:  11/19 CTA chest>> neg obvious PE, indeterminate r/t motion, some pleural effusion and RLL infiltrate.   SUBJECTIVE:  Pt reports "feeling a little better than on admit".  Denies fevers / chills, sweats, sputum production   VITAL SIGNS: Temp:  [97.6 F (36.4 C)-98.9 F (37.2 C)] 97.6 F (36.4 C) (11/23 0800) Pulse Rate:  [81-123] 92 (11/23 0800) Resp:  [17-29] 25 (11/23 0800) BP: (124-145)/(64-90) 130/78 mmHg (11/23 0800) SpO2:  [92 %-97 %] 96 % (11/23 0928)  PHYSICAL EXAMINATION: General:  Well appearing, NAD sitting OOB in chair  Neuro:  Awake, alert, appropriate, moving all ext to commands, normal strength. HEENT:  [place]/AT, PERRL, EOM-I and MMM. Cardiovascular:  RRR, Nl S1/S2, -M/R/G. Lungs:  Resps even non labored on RA, diminished bases.  Bronchial breath sounds on R lower Abdomen:  Soft, NT, ND and +BS. Musculoskeletal:  -edema and -tenderness. Skin:  Intact, no rashes or lesions   Recent Labs Lab 04/16/15 0645 04/17/15 0340 04/18/15 0320  NA 136 142 139  K 3.8 3.8 3.8  CL 102 107 105  CO2 27 29 27   BUN 11 14 11   CREATININE 0.54* 0.82 0.57*  GLUCOSE 116* 108* 105*    Recent Labs Lab 04/16/15 0645 04/17/15 0340 04/18/15 0320  HGB 12.9* 11.9* 12.5*  HCT 38.4* 36.2* 35.9*  WBC 11.1* 7.3 7.7  PLT 183 190 214   Dg Chest 1 View  04/17/2015  CLINICAL DATA:  Status post right-sided thoracentesis EXAM: CHEST 1 VIEW COMPARISON:  Study obtained  earlier in the day FINDINGS: Right effusion is considerably smaller following thoracentesis. No pneumothorax. There is a new small residual right effusion with patchy consolidation in the right base. Left lung is clear except for mild lateral left base atelectasis. Heart is borderline prominent with pulmonary vascular within normal limits. No adenopathy. No bone lesions. IMPRESSION: No pneumothorax. Small residual right effusion with right base atelectasis/consolidation. Minimal left base atelectasis. No new opacity. No change in cardiac silhouette. Electronically Signed   By: Bretta BangWilliam  Woodruff III M.D.   On: 04/17/2015 16:10   Dg Chest Port 1 View  04/17/2015  CLINICAL DATA:  Right pleural effusion. EXAM: PORTABLE CHEST 1 VIEW COMPARISON:  04/16/2015. FINDINGS: Mediastinum hilar structures are normal. Right perihilar/right lower lobe atelectasis and/or infiltrate with right pleural effusion again noted. Left lung is clear. Cardiomegaly with normal pulmonary vascularity. No acute bony abnormality . IMPRESSION: 1. Persistent right perihilar/right lower lobe atelectasis and/or infiltrate and right pleural effusion. 2. Persistent cardiomegaly.  No pulmonary venous congestion. Electronically Signed   By: Maisie Fushomas  Register   On: 04/17/2015 07:21   Dg Chest Port 1 View  04/16/2015  CLINICAL DATA:  29 year old male with shortness of breath, cough, congestion and weakness for the past 7 days EXAM: PORTABLE CHEST 1 VIEW COMPARISON:  Prior chest x-ray and CT scan of the chest 04/14/2015 FINDINGS: Stable cardiac and mediastinal contours. Persistent layering right-sided pleural effusion and associated right  basilar atelectasis versus infiltrate. Left lower lobe opacity is also similar compared to prior and favored to represent atelectasis. No pulmonary edema or pneumothorax. Overall, the volume of the pleural effusion may be slightly increased compared to prior. No acute osseous abnormality. IMPRESSION: 1. Similar to  slightly increased layering right pleural effusion and associated right lower lobe atelectasis and/or infiltrate. 2. Similar degree of left lower lobe atelectasis. Electronically Signed   By: Malachy Moan M.D.   On: 04/16/2015 17:55   US Thoracentesis Asp Pleural Space W/img Guide  04/17/2015  INDICATION: Pneumonia, dyspnea, right pleuritic chest discomfort, right pleural effusion. Request is made for diagnostic and therapeutic right thoracentesis. EXAM: ULTRASOUND GUIDED DIAGNOSTIC AND THERAPEUTIC RIGHT THORACENTESIS COMPARISON:  None. MEDICATIONS: None COMPLICATIONS: None immediate TECHNIQUE: Informed written consent was obtained from the patient after a discussion of the risks, benefits and alternatives to treatment. A timeout was performed prior to the initiation of the procedure. Initial ultrasound scanning demonstrates a small to moderate right pleural effusion. The lower chest was prepped and draped in the usual sterile fashion. 1% lidocaine was used for local anesthesia. An ultrasound image was saved for documentation purposes. A 6 Fr Safe-T-Centesis catheter was introduced. The thoracentesis was performed. The catheter was removed and a dressing was applied. The patient tolerated the procedure well without immediate post procedural complication. The patient was escorted to have an upright chest radiograph. FINDINGS: A total of approximately 600 cc of turbid, blood-tinged fluid was removed. Requested samples were sent to the laboratory. IMPRESSION: Successful ultrasound-guided diagnostic and therapeutic right sided thoracentesis yielding 600 cc of pleural fluid. Read by: Jeananne Rama, PA-C Electronically Signed   By: Gilmer Mor D.O.   On: 04/17/2015 16:55     ASSESSMENT / PLAN:  Discussion:  29 year old male with no significant PMH who presents to the hospital with RLL PNA, pleural effusion, hypoxemia and chest pain.   RLL infiltrate: suspect CAP  - zosyn/azithro  - supplemental O2 as  needed   - F/U on cultures.  - pulmonary hygiene   Pleural effusion: Parapneumonic v concern for empyema v malignancy.  ZOX=0960, exudative with pleural fluid LDH=170.  However, pct neg, serum wbc trending down, improving clinically, afebrile.   - Trend CXR  - Continue abx for now.  - HIV neg   - await cytology   Chest pain: likely pleurisy.  - NSAID.  - Continue abx.  Ok for tx floor from pulm standpoint.  Continue IV abx, trend pct and CXR.     Dirk Dress, NP 04/18/2015  10:15 AM Pager: [phone] or [phone]

## 2015-04-18 NOTE — Care Management Note (Signed)
Case Management Note  Patient Details  Name: Derwood KaplanRobert Milliner MRN: [id number] Date of Birth: [birth year]  Subjective/Objective:                sepsis    Action/Plan:Date: April 18, 2015 Chart reviewed for concurrent status and case management needs. Will continue to follow patient for changes and needs: Marcelle Smilinghonda Rashad Auld, RN, BSN, [place]   [phone]   Expected Discharge Date:                  Expected Discharge Plan:  Home/Self Care  In-House Referral:  NA  Discharge planning Services  CM Consult  Post Acute Care Choice:  NA Choice offered to:  NA  DME Arranged:    DME Agency:     HH Arranged:    HH Agency:     Status of Service:  In process, will continue to follow  Medicare Important Message Given:    Date Medicare IM Given:    Medicare IM give by:    Date Additional Medicare IM Given:    Additional Medicare Important Message give by:     If discussed at Long Length of Stay Meetings, dates discussed:    Additional Comments:  Golda AcreDavis, Carter Kaman Lynn, RN 04/18/2015, 12:27 PM

## 2015-04-18 NOTE — Progress Notes (Signed)
Pt transferred to 1517 from ICU. PT AOx4. Pt belongings are with pt at bedside. PT sitting in chair comfortable. No complaints from the pt at this time. Chan Rosasco W Jaydis Duchene, RN

## 2015-04-18 NOTE — Progress Notes (Addendum)
Patient ID: Ryan Leach, male   DOB: 06-20-1985, 29 y.o.   MRN: [id number]  [facility] PROGRESS NOTE  Jahzion Brogden JOI:[id number] DOB: [birth year] DOA: 04/14/2015 PCP: Marton Redwood, MD   Brief narrative:    29 year old never smoker with no significant PMH presented 11/19 with SOB, chest pain, fevers, chills and cough x 6 days.PCCM consulted for pleural effusion.   SIGNIFICANT EVENTS  11/19 admission for SOB and chest pain. 11/22>> IR thora - 612m turbid, blood tinged fluid   STUDIES:  11/19 CTA chest >> neg obvious PE, indeterminate r/t motion, some pleural effusion and RLL infiltrate.  Assessment/Plan:    Principal Problem:   Sepsis ([place] secondary to RLL PNA, CAP, unknown pathogen  - pt met criteria for sepsis on admission with T 100.9 F, HR > 120, RR up to 30, WBC 16 K - source RLL CAP, unknown pathogen at this time - continue with zosyn and Zithromax - sepsis etiology resolved and pt is clinically stable this AM - transfer to medical floor   Active Problems:   Acute respiratory failure with hypoxia - secondary to RLL CAP and associated right pleural effusion of unclear etiology - pt is now s/p thoracentesis, post op day #1, 600 cc fluid removed  - respiratory status is stable this AM but still requiring oxygen via [place] - continue ABX as noted above - will need to follow up on pleural fluid analysis     Sinus tachycardia ([place] - reactive due to the above - pt with no chest pain or shortness of breath this AM and HR in 80 -90's    Pleuritic chest pain - secondary to RLL and pleural effusion - pain better controlled this AM     RUQ abdominal pain - resolved     Hyperbilirubinemia - Abdominal ultrasound unremarkable - Hyperbilirubinemia resolved    Obesity  - Body mass index is 34.01 kg/(m^2).  Lovenox for DVT prophylaxis.  Code Status: Full.  Family Communication:  plan of care discussed with the patient Disposition Plan: Home when cleared by  PCCM, still awaiting for pleural fluid analysis report, anticipate by 11/25. OK to transfer to medical unit today.   IV access:  Peripheral IV  Procedures and diagnostic studies:    Dg Chest 1 View 04/17/2015 No pneumothorax. Small residual right effusion with right base atelectasis/consolidation. Minimal left base atelectasis. No new opacity. No change in cardiac silhouette.   Ct Angio Chest Pe W/cm &/or Wo Cm 04/14/2015 Significant patient respiratory motion degrading image quality. The lobar and segmental pulmonary artery branches are are suboptimal for evaluation of pulmonary emboli. No central pulmonary embolus. 2. Small right pleural effusion with right lower lobe airspace disease which may reflect atelectasis versus pneumonia  [place]dom[place] Complete 04/15/2015  No cholelithiasis or sonographic evidence for acute cholecystitis. No hydronephrosis.   Dg Chest Port 1 View 04/17/2015  Persistent right perihilar/right lower lobe atelectasis and/or infiltrate and right pleural effusion. 2. Persistent cardiomegaly.  No pulmonary venous congestion.   Dg Chest Port 1 View 04/16/2015 Similar to slightly increased layering right pleural effusion and associated right lower lobe atelectasis and/or infiltrate. 2. Similar degree of left lower lobe atelectasis.   Dg Chest Portable 1 View 04/14/2015 Suboptimal exam, secondary to patient size, AP portable technique, and likely minimal motion. Lower lobe predominant right-sided airspace disease is suspicious for infection. Atelectasis felt less likely. Cardiomegaly with suspicion of mild pulmonary venous congestion, superimposed upon extremely low lung volumes. Possible small right pleural effusion.  Consider PA and lateral radiographs if possible.   US Thoracentesis Asp Pleural Space W/img Guide 04/17/2015  Successful US-guided diagnostic and therapeutic right sided thoracentesis yielding 600 cc of pleural fluid.  Medical Consultants:  PCCM  Other  Consultants:  None  IAnti-Infectives:   IV Rocephin 04/14/2015>>>>> 04/16/2015 IV azithromycin 04/14/2015 IV Zosyn 04/16/2015   Faye Ramsay, MD  [facility] Pager [phone]  If 7PM-7AM, please contact night-coverage [email or web address] Password [email or web address] 04/18/2015, 11:05 AM   LOS: 4 days   HPI/Subjective: No events overnight.   Objective: Filed Vitals:   04/18/15 0700 04/18/15 0800 04/18/15 0928 04/18/15 1000  BP:  130/78    Pulse: 95 92    Temp:  97.6 F (36.4 C)    TempSrc:  Oral    Resp: 27 25  29   Height:      Weight:      SpO2: 97% 94% 96% 97%    Intake/Output Summary (Last 24 hours) at 04/18/15 1105 Last data filed at 04/18/15 0335  Gross per 24 hour  Intake    300 ml  Output      0 ml  Net    300 ml    Exam:   General:  Pt is alert, follows commands appropriately, not in acute distress  Cardiovascular: Regular rate and rhythm, S1/S2, no murmurs, no rubs, no gallops  Respiratory: Clear to auscultation bilaterally, no wheezing, diminished sounds on the right side   Abdomen: Soft, non tender, non distended, bowel sounds present, no guarding  Extremities: No edema, pulses DP and PT palpable bilaterally  Neuro: Grossly nonfocal  Data Reviewed: Basic Metabolic Panel:  Recent Labs Lab 04/14/15 1646 04/15/15 0147 04/16/15 0645 04/17/15 0340 04/18/15 0320  NA 139 135 136 142 139  K 4.0 3.9 3.8 3.8 3.8  CL 102 103 102 107 105  CO2 26 25 27 29 27   GLUCOSE 110* 133* 116* 108* 105*  BUN 14 15 11 14 11   CREATININE 0.62 0.66 0.54* 0.82 0.57*  CALCIUM 9.7 8.4* 8.4* 8.7* 9.0   Liver Function Tests:  Recent Labs Lab 04/14/15 1646 04/15/15 0851 04/16/15 0645 04/18/15 0320  AST 20 17 22 29   ALT 27 22 27  44  ALKPHOS 81 72 67 62  BILITOT 2.5* 2.0* 1.1 0.6  PROT 8.6* 7.6 6.8 6.4*  ALBUMIN 4.8 4.0 3.5 3.0*    Recent Labs Lab 04/14/15 1646  LIPASE 24   CBC:  Recent Labs Lab 04/14/15 1646 04/15/15 0147 04/16/15 0645 04/17/15 0340  04/18/15 0320  WBC 16.3* 12.3* 11.1* 7.3 7.7  NEUTROABS  --   --  8.2*  --   --   HGB 15.2 14.0 12.9* 11.9* 12.5*  HCT 43.0 40.7 38.4* 36.2* 35.9*  MCV 87.4 88.9 89.5 90.7 89.8  PLT 249 217 183 190 214   Recent Results (from the past 240 hour(s))  Culture, blood (routine x 2)     Status: None (Preliminary result)   Collection Time: 04/14/15  7:35 PM  Result Value Ref Range Status   Specimen Description BLOOD LEFT HAND  Final   Special Requests IN PEDIATRIC BOTTLE [place]  Final   Culture   Final    NO GROWTH 3 DAYS Performed at [facility]    Report Status PENDING  Incomplete  Culture, blood (routine x 2)     Status: None (Preliminary result)   Collection Time: 04/14/15  7:50 PM  Result Value Ref Range Status   Specimen Description BLOOD RIGHT HAND  Final  Special Requests BOTTLES DRAWN AEROBIC AND ANAEROBIC 5ML  Final   Culture   Final    NO GROWTH 3 DAYS Performed at [facility]    Report Status PENDING  Incomplete  MRSA PCR Screening     Status: None   Collection Time: 04/14/15  8:29 PM  Result Value Ref Range Status   MRSA by PCR NEGATIVE NEGATIVE Final    Comment:        The GeneXpert MRSA Assay (FDA approved for NASAL specimens only), is one component of a comprehensive MRSA colonization surveillance program. It is not intended to diagnose MRSA infection nor to guide or monitor treatment for MRSA infections.   Culture, Urine     Status: None   Collection Time: 04/15/15  3:25 AM  Result Value Ref Range Status   Specimen Description URINE, CLEAN CATCH  Final   Special Requests NONE  Final   Culture   Final    NO GROWTH 1 DAY Performed at [facility]    Report Status 04/16/2015 FINAL  Final  Culture, body fluid-bottle     Status: None (Preliminary result)   Collection Time: 04/17/15  4:05 PM  Result Value Ref Range Status   Specimen Description FLUID RIGHT PLEURAL  Final   Special Requests BOTTLES DRAWN AEROBIC AND ANAEROBIC 10CC   Final   Culture PENDING  Incomplete   Report Status PENDING  Incomplete  Gram stain     Status: None   Collection Time: 04/17/15  4:05 PM  Result Value Ref Range Status   Specimen Description FLUID RIGHT PLEURAL  Final   Special Requests NONE  Final   Gram Stain   Final    ABUNDANT WBC PRESENT, PREDOMINANTLY MONONUCLEAR NO ORGANISMS SEEN Performed at [facility]    Report Status 04/17/2015 FINAL  Final     Scheduled Meds: . azithromycin  500 mg Intravenous QPC supper  . enoxaparin  injection  40 mg Subcutaneous Q24H  . guaiFENesin  1,200 mg Oral BID  . ibuprofen  800 mg Oral TID  . levalbuterol  0.63 mg Nebulization TID  . loratadine  10 mg Oral Daily  . pantoprazole  40 mg Oral Q0600  . piperacillin-tazobactam (ZOSYN)  IV  3.375 g Intravenous Q8H   Continuous Infusions:

## 2015-04-19 ENCOUNTER — Telehealth: Payer: Self-pay | Admitting: Internal Medicine

## 2015-04-19 ENCOUNTER — Inpatient Hospital Stay (HOSPITAL_COMMUNITY): Payer: BLUE CROSS/BLUE SHIELD

## 2015-04-19 LAB — CBC
HEMATOCRIT: 37.1 % — AB (ref 39.0–52.0)
HEMOGLOBIN: 12.6 g/dL — AB (ref 13.0–17.0)
MCH: 29.8 pg (ref 26.0–34.0)
MCHC: 34 g/dL (ref 30.0–36.0)
MCV: 87.7 fL (ref 78.0–100.0)
Platelets: 255 10*3/uL (ref 150–400)
RBC: 4.23 MIL/uL (ref 4.22–5.81)
RDW: 11.9 % (ref 11.5–15.5)
WBC: 8.2 10*3/uL (ref 4.0–10.5)

## 2015-04-19 LAB — BASIC METABOLIC PANEL
ANION GAP: 8 (ref 5–15)
BUN: 14 mg/dL (ref 6–20)
CALCIUM: 9.2 mg/dL (ref 8.9–10.3)
CO2: 26 mmol/L (ref 22–32)
Chloride: 106 mmol/L (ref 101–111)
Creatinine, Ser: 0.62 mg/dL (ref 0.61–1.24)
GFR calc non Af Amer: >60 mL/min (ref >60)
GLUCOSE: 105 mg/dL — AB (ref 65–99)
POTASSIUM: 4.3 mmol/L (ref 3.5–5.1)
Sodium: 140 mmol/L (ref 135–145)

## 2015-04-19 LAB — CULTURE, BLOOD (ROUTINE X 2)
CULTURE: NO GROWTH
Culture: NO GROWTH

## 2015-04-19 MED ORDER — LORATADINE 10 MG PO TABS: 10.0000 mg | ORAL_TABLET | Freq: Every day | ORAL | Status: AC

## 2015-04-19 MED ORDER — ALBUTEROL SULFATE (2.5 MG/3ML) 0.083% IN NEBU: 2.5000 mg | INHALATION_SOLUTION | Freq: Four times a day (QID) | RESPIRATORY_TRACT | Status: AC | PRN

## 2015-04-19 MED ORDER — GUAIFENESIN ER 600 MG PO TB12: 1200.0000 mg | ORAL_TABLET | Freq: Two times a day (BID) | ORAL | Status: AC

## 2015-04-19 MED ORDER — OXYCODONE HCL 5 MG PO TABS: 5.0000 mg | ORAL_TABLET | ORAL | Status: AC | PRN

## 2015-04-19 MED ORDER — ONDANSETRON HCL 4 MG PO TABS: 4.0000 mg | ORAL_TABLET | Freq: Four times a day (QID) | ORAL | Status: DC | PRN

## 2015-04-19 MED ORDER — LEVOFLOXACIN 500 MG PO TABS: 500.0000 mg | ORAL_TABLET | Freq: Every day | ORAL | Status: DC

## 2015-04-19 MED ORDER — PANTOPRAZOLE SODIUM 40 MG PO TBEC: 40.0000 mg | DELAYED_RELEASE_TABLET | Freq: Every day | ORAL | Status: AC

## 2015-04-19 NOTE — Progress Notes (Signed)
Patient discharged.  IV removed, site clean/dry/intact.  Patient educated on discharge instructions, follow-up appointments, and discharge medications.  Hard copy of scripts given to patient.  Patient notified that Pulmonary office will call to schedule appointment for next week for f/u CXR.  Educated patient on other needed appointments and showed numbers to call (offices closed currently).  Patient stated understanding and signed AVS.  No acute needs at this time. Belongings gathered, escorted downstairs to ride.

## 2015-04-19 NOTE — Discharge Summary (Signed)
Physician Discharge Summary  Ryan Leach DVV:[id number] DOB: November 03, 1985 DOA: 04/14/2015  PCP: Marton Redwood, MD  Admit date: 04/14/2015 Discharge date: 04/19/2015  Recommendations for Outpatient Follow-up:  1. Pt will need to follow up with PCP in 2-3 weeks post discharge 2. Pt will continue taking Levaquin upon discharge to complete therapy 3. Pt also advised that he will be contacted by pulmonologist office for appointment time and date, he was made aware he needs appointment next week   Discharge Diagnoses:  Principal Problem:   Sepsis ([place]) Active Problems:   CAP (community acquired pneumonia)   Sinus tachycardia ([place])   Pleuritic chest pain   RUQ abdominal pain   Leukocytosis   Hyperbilirubinemia   RUQ pain   Pleural effusion on right   Acute respiratory failure with hypoxia ([place])   S/P thoracentesis  Discharge Condition: Stable  Diet recommendation: Heart healthy diet discussed in details    Brief narrative:    29 year old never smoker with no significant PMH presented 11/19 with SOB, chest pain, fevers, chills and cough x 6 days.PCCM consulted for pleural effusion.   SIGNIFICANT EVENTS  11/19 admission for SOB and chest pain. 11/22>> IR thora - 64m turbid, blood tinged fluid   STUDIES:  11/19 CTA chest >> neg obvious PE, indeterminate r/t motion, some pleural effusion and RLL infiltrate.  Assessment/Plan:    Principal Problem:  Sepsis ([place] secondary to RLL PNA, CAP, unknown pathogen  - pt met criteria for sepsis on admission with T 100.9 F, HR > 120, RR up to 30, WBC 16 K - source RLL CAP, unknown pathogen at this time - continue Levaquin upon d/c  - sepsis etiology resolved and pt is clinically stable this AM, wants to go home  Active Problems:  Acute respiratory failure with hypoxia - secondary to RLL CAP and associated right pleural effusion of unclear etiology - pt is now s/p thoracentesis, post op day #2, 600 cc fluid removed  -  respiratory status is stable this AM but still requiring oxygen via [place] - continue ABX as noted above - will need to follow up on pleural fluid analysis    Sinus tachycardia ([place] - reactive due to the above - pt with no chest pain or shortness of breath this AM and HR in 80 -90's   Pleuritic chest pain - secondary to RLL and pleural effusion - pain better controlled this AM    RUQ abdominal pain - resolved    Hyperbilirubinemia - Abdominal ultrasound unremarkable - Hyperbilirubinemia resolved   Obesity  - Body mass index is 34.01 kg/(m^2).  Code Status: Full.  Family Communication: plan of care discussed with the patient Disposition Plan: Home  IV access:  Peripheral IV  Procedures and diagnostic studies:   Dg Chest 1 View 04/17/2015 No pneumothorax. Small residual right effusion with right base atelectasis/consolidation. Minimal left base atelectasis. No new opacity. No change in cardiac silhouette.   Ct Angio Chest Pe W/cm &/or Wo Cm 04/14/2015 Significant patient respiratory motion degrading image quality. The lobar and segmental pulmonary artery branches are are suboptimal for evaluation of pulmonary emboli. No central pulmonary embolus. 2. Small right pleural effusion with right lower lobe airspace disease which may reflect atelectasis versus pneumonia  [place]domen Complete 04/15/2015 No cholelithiasis or sonographic evidence for acute cholecystitis. No hydronephrosis.   Dg Chest Port 1 View 04/17/2015 Persistent right perihilar/right lower lobe atelectasis and/or infiltrate and right pleural effusion. 2. Persistent cardiomegaly. No pulmonary venous congestion.   Dg  Chest Port 1 View 04/16/2015 Similar to slightly increased layering right pleural effusion and associated right lower lobe atelectasis and/or infiltrate. 2. Similar degree of left lower lobe atelectasis.   Dg Chest Portable 1 View 04/14/2015 Suboptimal exam, secondary to patient size, AP  portable technique, and likely minimal motion. Lower lobe predominant right-sided airspace disease is suspicious for infection. Atelectasis felt less likely. Cardiomegaly with suspicion of mild pulmonary venous congestion, superimposed upon extremely low lung volumes. Possible small right pleural effusion. Consider PA and lateral radiographs if possible.   US Thoracentesis Asp Pleural Space W/img Guide 04/17/2015 Successful US-guided diagnostic and therapeutic right sided thoracentesis yielding 600 cc of pleural fluid.  Medical Consultants:  PCCM  Other Consultants:  None     Procedures/Studies: Dg Chest 1 View  04/17/2015  CLINICAL DATA:  Status post right-sided thoracentesis EXAM: CHEST 1 VIEW COMPARISON:  Study obtained earlier in the day FINDINGS: Right effusion is considerably smaller following thoracentesis. No pneumothorax. There is a new small residual right effusion with patchy consolidation in the right base. Left lung is clear except for mild lateral left base atelectasis. Heart is borderline prominent with pulmonary vascular within normal limits. No adenopathy. No bone lesions. IMPRESSION: No pneumothorax. Small residual right effusion with right base atelectasis/consolidation. Minimal left base atelectasis. No new opacity. No change in cardiac silhouette. Electronically Signed   By: Lowella Grip III M.D.   On: 04/17/2015 16:10   Dg Chest 2 View  04/19/2015  CLINICAL DATA:  Follow-up pneumonia, pleural effusion. EXAM: CHEST  2 VIEW COMPARISON:  Chest x-rays dated 04/18/2015 04/17/2015. FINDINGS: No interval change of the right basilar opacity which likely represents a combination of atelectasis and pleural effusion. Suspect moderate-sized pleural effusion, but it is unclear which component is more extensive. Mild atelectasis persists at the left lung base. No new lung findings. Mild cardiomegaly is unchanged. Overall cardiomediastinal silhouette is stable in size and  configuration. No pneumothorax seen. No osseous abnormality. IMPRESSION: Stable chest x-ray. Opacity at the right lung base is unchanged compared to yesterday's exam, likely a combination of atelectasis and pleural effusion. Underlying right lower lobe pneumonia cannot be excluded. Electronically Signed   By: Franki Cabot M.D.   On: 04/19/2015 08:58   Dg Chest 2 View  04/18/2015  CLINICAL DATA:  Followup pleural effusion. EXAM: CHEST  2 VIEW COMPARISON:  04/17/2015 FINDINGS: The heart size is mildly enlarged. There is atelectasis in the left base. There has been partial reaccumulation of moderate right pleural effusion with overlying atelectasis. No pneumothorax. IMPRESSION: 1. Partial reaccumulation of right pleural effusion status post thoracentesis. Electronically Signed   By: Kerby Moors M.D.   On: 04/18/2015 15:30   Ct Angio Chest Pe W/cm &/or Wo Cm  04/14/2015  CLINICAL DATA:  Right-sided chest pain and abdominal pain EXAM: CT ANGIOGRAPHY CHEST WITH CONTRAST TECHNIQUE: Multidetector CT imaging of the chest was performed using the standard protocol during bolus administration of intravenous contrast. Multiplanar CT image reconstructions and MIPs were obtained to evaluate the vascular anatomy. CONTRAST:  175m OMNIPAQUE IOHEXOL 350 MG/ML SOLN COMPARISON:  None. FINDINGS: There is significant patient respiratory motion degrading image quality. The lobar and segmental pulmonary artery branches are are suboptimal for evaluation of pulmonary emboli. There is no central pulmonary embolus. The main pulmonary artery, right main pulmonary artery and left main pulmonary arteries are normal in size. The heart size is enlarged. There is no pericardial effusion. There is a small right pleural effusion. There is right lower  lobe airspace disease. There is mild right middle lobe atelectasis. There is no axillary, hilar, or mediastinal adenopathy. There is no lytic or blastic osseous lesion. The visualized portions  of the upper abdomen are unremarkable. Review of the MIP images confirms the above findings. IMPRESSION: 1. Significant patient respiratory motion degrading image quality. The lobar and segmental pulmonary artery branches are are suboptimal for evaluation of pulmonary emboli. No central pulmonary embolus. 2. Small right pleural effusion with right lower lobe airspace disease which may reflect atelectasis versus pneumonia. Electronically Signed   By: Kathreen Devoid   On: 04/14/2015 18:45   US Abdomen Complete  04/15/2015  CLINICAL DATA:  Patient with right upper quadrant pain for 6 days. EXAM: ULTRASOUND ABDOMEN COMPLETE COMPARISON:  None. FINDINGS: Gallbladder: No gallstones or wall thickening visualized. No sonographic Murphy sign noted. Common bile duct: Diameter: 3 mm Liver: No focal lesion identified. Within normal limits in parenchymal echogenicity. IVC: No abnormality visualized. Pancreas: Visualized portion unremarkable. Spleen: Size and appearance within normal limits. Right Kidney: Length: 12.3 cm. Echogenicity within normal limits. No mass or hydronephrosis visualized. Left Kidney: Length: 14.1 cm. Echogenicity within normal limits. No mass or hydronephrosis visualized. Abdominal aorta: Not visualized due to overlying bowel gas. Other findings: None. IMPRESSION: No cholelithiasis or sonographic evidence for acute cholecystitis. No hydronephrosis. Electronically Signed   By: Lovey Newcomer M.D.   On: 04/15/2015 10:50   Dg Chest Port 1 View  04/17/2015  CLINICAL DATA:  Right pleural effusion. EXAM: PORTABLE CHEST 1 VIEW COMPARISON:  04/16/2015. FINDINGS: Mediastinum hilar structures are normal. Right perihilar/right lower lobe atelectasis and/or infiltrate with right pleural effusion again noted. Left lung is clear. Cardiomegaly with normal pulmonary vascularity. No acute bony abnormality . IMPRESSION: 1. Persistent right perihilar/right lower lobe atelectasis and/or infiltrate and right pleural effusion.  2. Persistent cardiomegaly.  No pulmonary venous congestion. Electronically Signed   By: Marcello Moores  Register   On: 04/17/2015 07:21   Dg Chest Port 1 View  04/16/2015  CLINICAL DATA:  29 year old male with shortness of breath, cough, congestion and weakness for the past 7 days EXAM: PORTABLE CHEST 1 VIEW COMPARISON:  Prior chest x-ray and CT scan of the chest 04/14/2015 FINDINGS: Stable cardiac and mediastinal contours. Persistent layering right-sided pleural effusion and associated right basilar atelectasis versus infiltrate. Left lower lobe opacity is also similar compared to prior and favored to represent atelectasis. No pulmonary edema or pneumothorax. Overall, the volume of the pleural effusion may be slightly increased compared to prior. No acute osseous abnormality. IMPRESSION: 1. Similar to slightly increased layering right pleural effusion and associated right lower lobe atelectasis and/or infiltrate. 2. Similar degree of left lower lobe atelectasis. Electronically Signed   By: Jacqulynn Cadet M.D.   On: 04/16/2015 17:55   Dg Chest Portable 1 View  04/14/2015  CLINICAL DATA:  Pt complaining of RUQ abdominal pain on-going x 4 days. Worse with movement/pressure. "When I try to sleep it feels like I'm suffocating the pain is that bad." Denies N/V/D. Nonsmoker. EXAM: PORTABLE CHEST 1 VIEW COMPARISON:  None. FINDINGS: Mildly degraded exam due to AP portable technique and patient body habitus. Patient rotated to the left. Midline trachea. Cardiomegaly accentuated by AP portable technique. Possible small right pleural effusion. No pneumothorax. Extremely low lung volumes, accentuating the pulmonary interstitium. Suspect lower lobe predominant right sided airspace disease. Cannot exclude concurrent mild pulmonary venous congestion. IMPRESSION: Suboptimal exam, secondary to patient size, AP portable technique, and likely minimal motion. Lower lobe predominant right-sided  airspace disease is suspicious for  infection. Atelectasis felt less likely. Cardiomegaly with suspicion of mild pulmonary venous congestion, superimposed upon extremely low lung volumes. Possible small right pleural effusion. Consider PA and lateral radiographs if possible. Electronically Signed   By: Abigail Miyamoto M.D.   On: 04/14/2015 17:15   US Thoracentesis Asp Pleural Space W/img Guide  04/17/2015  INDICATION: Pneumonia, dyspnea, right pleuritic chest discomfort, right pleural effusion. Request is made for diagnostic and therapeutic right thoracentesis. EXAM: ULTRASOUND GUIDED DIAGNOSTIC AND THERAPEUTIC RIGHT THORACENTESIS COMPARISON:  None. MEDICATIONS: None COMPLICATIONS: None immediate TECHNIQUE: Informed written consent was obtained from the patient after a discussion of the risks, benefits and alternatives to treatment. A timeout was performed prior to the initiation of the procedure. Initial ultrasound scanning demonstrates a small to moderate right pleural effusion. The lower chest was prepped and draped in the usual sterile fashion. 1% lidocaine was used for local anesthesia. An ultrasound image was saved for documentation purposes. A 6 Fr Safe-T-Centesis catheter was introduced. The thoracentesis was performed. The catheter was removed and a dressing was applied. The patient tolerated the procedure well without immediate post procedural complication. The patient was escorted to have an upright chest radiograph. FINDINGS: A total of approximately 600 cc of turbid, blood-tinged fluid was removed. Requested samples were sent to the laboratory. IMPRESSION: Successful ultrasound-guided diagnostic and therapeutic right sided thoracentesis yielding 600 cc of pleural fluid. Read by: Rowe Alexi, PA-C Electronically Signed   By: Corrie Mckusick D.O.   On: 04/17/2015 16:55    Discharge Exam: Filed Vitals:   04/19/15 0051 04/19/15 0428  BP: 119/73 118/79  Pulse: 79 80  Temp: 97.7 F (36.5 C) 97.7 F (36.5 C)  Resp: 16 16   Filed  Vitals:   04/18/15 1849 04/18/15 2117 04/19/15 0051 04/19/15 0428  BP: 118/73 116/65 119/73 118/79  Pulse: 93 96 79 80  Temp: 98.1 F (36.7 C) 97.8 F (36.6 C) 97.7 F (36.5 C) 97.7 F (36.5 C)  TempSrc: Oral Oral Oral Oral  Resp: 18 18 16 16   Height: 5' 9"  (1.753 m)     Weight:      SpO2: 95% 97% 95% 96%    General: Pt is alert, follows commands appropriately, not in acute distress Cardiovascular: Regular rate and rhythm, S1/S2 +, no murmurs, no rubs, no gallops Respiratory: diminished breath sounds on the right side  Abdominal: Soft, non tender, non distended, bowel sounds +, no guarding Extremities: no edema, no cyanosis, pulses palpable bilaterally DP and PT Neuro: Grossly nonfocal  Discharge Instructions     Medication List    TAKE these medications        ADVIL PM 200-25 MG Caps  Generic drug:  Ibuprofen-Diphenhydramine HCl  Take 2 capsules by mouth at bedtime as needed (pain.).     albuterol (2.5 MG/3ML) 0.083% nebulizer solution  Commonly known as:  PROVENTIL  Take 3 mLs (2.5 mg total) by nebulization every 6 (six) hours as needed for wheezing or shortness of breath.     guaiFENesin 600 MG 12 hr tablet  Commonly known as:  MUCINEX  Take 2 tablets (1,200 mg total) by mouth 2 (two) times daily.     levofloxacin 500 MG tablet  Commonly known as:  LEVAQUIN  Take 1 tablet (500 mg total) by mouth daily.     loratadine 10 MG tablet  Commonly known as:  CLARITIN  Take 1 tablet (10 mg total) by mouth daily.     ondansetron 4  MG tablet  Commonly known as:  ZOFRAN  Take 1 tablet (4 mg total) by mouth every 6 (six) hours as needed for nausea.     oxyCODONE 5 MG immediate release tablet  Commonly known as:  Oxy IR/ROXICODONE  Take 1 tablet (5 mg total) by mouth every 4 (four) hours as needed for moderate pain.     pantoprazole 40 MG tablet  Commonly known as:  PROTONIX  Take 1 tablet (40 mg total) by mouth daily at 6 (six) AM.            Follow-up  Information    Follow up with [facility], MD On 07/09/2015.   Specialty:  Pulmonary Disease   Why:  [facility] -- 430pm - with chest xray    Contact information:   [place] [address] [phone]       Follow up with Marton Redwood, MD.   Specialty:  Internal Medicine   Contact information:   [address] [place] [address] [phone]       Call Faye Ramsay, MD.   Specialty:  Internal Medicine   Why:  As needed call my cell phone [phone]   Contact information:   [address] [place] [address] [phone]        The results of significant diagnostics from this hospitalization (including imaging, microbiology, ancillary and laboratory) are listed below for reference.     Microbiology: Recent Results (from the past 240 hour(s))  Culture, blood (routine x 2)     Status: None (Preliminary result)   Collection Time: 04/14/15  7:35 PM  Result Value Ref Range Status   Specimen Description BLOOD LEFT HAND  Final   Special Requests IN PEDIATRIC BOTTLE [place]  Final   Culture   Final    NO GROWTH 4 DAYS Performed at [facility]    Report Status PENDING  Incomplete  Culture, blood (routine x 2)     Status: None (Preliminary result)   Collection Time: 04/14/15  7:50 PM  Result Value Ref Range Status   Specimen Description BLOOD RIGHT HAND  Final   Special Requests BOTTLES DRAWN AEROBIC AND ANAEROBIC 5ML  Final   Culture   Final    NO GROWTH 4 DAYS Performed at [facility]    Report Status PENDING  Incomplete  MRSA PCR Screening     Status: None   Collection Time: 04/14/15  8:29 PM  Result Value Ref Range Status   MRSA by PCR NEGATIVE NEGATIVE Final    Comment:        The GeneXpert MRSA Assay (FDA approved for NASAL specimens only), is one component of a comprehensive MRSA colonization surveillance program. It is not intended to diagnose MRSA infection nor to guide or monitor  treatment for MRSA infections.   Culture, Urine     Status: None   Collection Time: 04/15/15  3:25 AM  Result Value Ref Range Status   Specimen Description URINE, CLEAN CATCH  Final   Special Requests NONE  Final   Culture   Final    NO GROWTH 1 DAY Performed at [facility]    Report Status 04/16/2015 FINAL  Final  Culture, body fluid-bottle     Status: None (Preliminary result)   Collection Time: 04/17/15  4:05 PM  Result Value Ref Range Status   Specimen Description FLUID RIGHT PLEURAL  Final   Special Requests BOTTLES DRAWN AEROBIC AND ANAEROBIC 10CC  Final  Culture   Final    NO GROWTH < 24 HOURS Performed at [facility]    Report Status PENDING  Incomplete  Gram stain     Status: None   Collection Time: 04/17/15  4:05 PM  Result Value Ref Range Status   Specimen Description FLUID RIGHT PLEURAL  Final   Special Requests NONE  Final   Gram Stain   Final    ABUNDANT WBC PRESENT, PREDOMINANTLY MONONUCLEAR NO ORGANISMS SEEN Performed at [facility]    Report Status 04/17/2015 FINAL  Final     Labs: Basic Metabolic Panel:  Recent Labs Lab 04/15/15 0147 04/16/15 0645 04/17/15 0340 04/18/15 0320 04/19/15 0525  NA 135 136 142 139 140  K 3.9 3.8 3.8 3.8 4.3  CL 103 102 107 105 106  CO2 25 27 29 27 26   GLUCOSE 133* 116* 108* 105* 105*  BUN 15 11 14 11 14   CREATININE 0.66 0.54* 0.82 0.57* 0.62  CALCIUM 8.4* 8.4* 8.7* 9.0 9.2   Liver Function Tests:  Recent Labs Lab 04/14/15 1646 04/15/15 0851 04/16/15 0645 04/18/15 0320  AST 20 17 22 29   ALT 27 22 27  44  ALKPHOS 81 72 67 62  BILITOT 2.5* 2.0* 1.1 0.6  PROT 8.6* 7.6 6.8 6.4*  ALBUMIN 4.8 4.0 3.5 3.0*    Recent Labs Lab 04/14/15 1646  LIPASE 24   CBC:  Recent Labs Lab 04/15/15 0147 04/16/15 0645 04/17/15 0340 04/18/15 0320 04/19/15 0525  WBC 12.3* 11.1* 7.3 7.7 8.2  NEUTROABS  --  8.2*  --   --   --   HGB 14.0 12.9* 11.9* 12.5* 12.6*  HCT 40.7 38.4* 36.2* 35.9*  37.1*  MCV 88.9 89.5 90.7 89.8 87.7  PLT 217 183 190 214 255   SIGNED: Time coordinating discharge: 30 minutes  MAGICK-MYERS, ISKRA, MD  [facility] 04/19/2015, 9:11 AM Pager [phone]  If 7PM-7AM, please contact night-coverage [email or web address] Password [email or web address]

## 2015-04-19 NOTE — Discharge Instructions (Signed)

## 2015-04-19 NOTE — Telephone Encounter (Signed)
tRIAGE  [facility] Kaplan[facility]. Hospitalist called me saying 9:38 AM 04/19/2015 - patient adamant to go home for thanksgiving. CXR today to me looks rt pl effusion might be coming back. Pls give him fu to see any provider approx 04/25/15 or 04/26/15. Needs CXR 2 view.He does have fu with me in feb 2017 but that is too far out  Thanks  Dr. Kalman ShanMurali Charleigh Correnti, M.D., [facility] Hospital[facility].C.C.P Pulmonary and Critical Care Medicine Staff Physician [facility] Pager: [phone], If no answer or between  15:00h - 7:00h: call [phone]  04/19/2015 9:39 AM

## 2015-04-20 NOTE — Telephone Encounter (Signed)
LMTCB x1 for pt.  

## 2015-04-22 LAB — CULTURE, BODY FLUID W GRAM STAIN -BOTTLE: Culture: NO GROWTH

## 2015-04-23 NOTE — Telephone Encounter (Signed)
Patient calling back.  He states if he could be Wednesday that would be great, he follows up with his PCP on Thurs.  He can be reached at [phone].

## 2015-04-23 NOTE — Telephone Encounter (Signed)
Patient calling to get appointment for Hospital follow up.  There are no appointments available for MR.  Gave him appointment with Dr. Vassie LollAlva on Wed 11/30.  Nothing further needed. Closing encounter

## 2015-04-25 ENCOUNTER — Ambulatory Visit (INDEPENDENT_AMBULATORY_CARE_PROVIDER_SITE_OTHER): Payer: BLUE CROSS/BLUE SHIELD | Admitting: Pulmonary Disease

## 2015-04-25 ENCOUNTER — Telehealth: Payer: Self-pay | Admitting: Pulmonary Disease

## 2015-04-25 ENCOUNTER — Encounter: Payer: Self-pay | Admitting: Pulmonary Disease

## 2015-04-25 ENCOUNTER — Ambulatory Visit (INDEPENDENT_AMBULATORY_CARE_PROVIDER_SITE_OTHER)
Admission: RE | Admit: 2015-04-25 | Discharge: 2015-04-25 | Disposition: A | Discharge status: Home / Self Care | Payer: BLUE CROSS/BLUE SHIELD | Source: Ambulatory Visit | Attending: Pulmonary Disease | Admitting: Pulmonary Disease

## 2015-04-25 VITALS — BP 128/84 | HR 97 | Temp 97.8°F | Ht 69.0 in | Wt 228.2 lb

## 2015-04-25 DIAGNOSIS — J90 Pleural effusion, not elsewhere classified: Secondary | ICD-10-CM

## 2015-04-25 DIAGNOSIS — J948 Other specified pleural conditions: Secondary | ICD-10-CM

## 2015-04-25 NOTE — Progress Notes (Signed)
   Subjective:    Patient ID: Ryan KaplanRobert Leach, male    DOB: Jun 21, 1985, 29 y.o.   MRN: [id number]  HPI  29 year old never smoker with no significant PMH presented 11/19 with SOB, chest pain, fevers, chills and cough x 6 days & RUQ pain sharp/ pleuritic .  PCCM consulted for Small Rt sided exudative effusion    Chief Complaint  Patient presents with  . Hospitalization Follow-up    CAP; 1 liter 1/2 of fluid removed from lungs during hospital stay.  Feeling better today.  Wants to know where all the fluid came from and does he have to worry about the fluid coming back   He feels much improved after thoracentesis, no pain, fevers Hosp course reviewed No skin rash  Chest x-ray today shows small residual effusion  Significant tests/ events  11/22 /16 600cc reddish fluid - mononuclear exudate but not complicated. Cytology neg 11/19 CT chest  - ruled out big PE + motion artifact  Review of Systems neg for any significant sore throat, dysphagia, itching, sneezing, nasal congestion or excess/ purulent secretions, fever, chills, sweats, unintended wt loss, pleuritic or exertional cp, hempoptysis, orthopnea pnd or change in chronic leg swelling. Also denies presyncope, palpitations, heartburn, abdominal pain, nausea, vomiting, diarrhea or change in bowel or urinary habits, dysuria,hematuria, rash, arthralgias, visual complaints, headache, numbness weakness or ataxia.     Objective:   Physical Exam  Gen. Pleasant, well-nourished, in no distress ENT - no lesions, no post nasal drip Neck: No JVD, no thyromegaly, no carotid bruits Lungs: no use of accessory muscles, no dullness to percussion, decreased rt base without rales or rhonchi  Cardiovascular: Rhythm regular, heart sounds  normal, no murmurs or gallops, no peripheral edema Musculoskeletal: No deformities, no cyanosis or clubbing                         Assessment & Plan:

## 2015-04-25 NOTE — Assessment & Plan Note (Signed)
This showed a mononuclear exudate- since this was acute onset, differential diagnosis includes viral pleurisy and less likely inflammation. Cytology was negative and malignancy would be less likely in this age group.  He does seem to have a small residual effusion on chest x-ray-we will repeat a chest x-ray in one month for resolution. If his symptoms recur, we will repeat x-ray sooner. If effusion is persistent, he may need a pleural biopsy. I discussed the need for serial follow up in detail.

## 2015-04-25 NOTE — Telephone Encounter (Signed)
Called and spoke to pt. Informed him of the results and recs per RA. Appt made with TP for 05/22/2015. Pt verbalized understanding and denied any further questions or concerns at this time.    Notes Recorded by Oretha Milchakesh Alva V, MD on 04/25/2015 at 2:14 PM Small rt effusion still present FU CXR next visit in 60month - or sooner if symptoms recur

## 2015-04-25 NOTE — Patient Instructions (Signed)
CXR today FLuid could have collected due to viral pleurisy or inflammation

## 2015-04-25 NOTE — Progress Notes (Signed)
Quick Note:  Called and spoke to pt. Informed him of the results and recs per RA. Appt made with TP for 05/22/2015. Pt verbalized understanding and denied any further questions or concerns at this time. ______

## 2015-05-22 ENCOUNTER — Ambulatory Visit: Payer: BLUE CROSS/BLUE SHIELD | Admitting: Adult Health

## 2015-05-29 ENCOUNTER — Encounter: Payer: Self-pay | Admitting: Adult Health

## 2015-05-29 ENCOUNTER — Ambulatory Visit (INDEPENDENT_AMBULATORY_CARE_PROVIDER_SITE_OTHER): Payer: BLUE CROSS/BLUE SHIELD | Admitting: Adult Health

## 2015-05-29 ENCOUNTER — Ambulatory Visit (INDEPENDENT_AMBULATORY_CARE_PROVIDER_SITE_OTHER)
Admission: RE | Admit: 2015-05-29 | Discharge: 2015-05-29 | Disposition: A | Discharge status: Home / Self Care | Payer: BLUE CROSS/BLUE SHIELD | Source: Ambulatory Visit | Attending: Adult Health | Admitting: Adult Health

## 2015-05-29 VITALS — BP 138/80 | HR 104 | Temp 98.0°F | Ht 69.0 in | Wt 237.0 lb

## 2015-05-29 DIAGNOSIS — J90 Pleural effusion, not elsewhere classified: Secondary | ICD-10-CM | POA: Diagnosis not present

## 2015-05-29 DIAGNOSIS — J948 Other specified pleural conditions: Secondary | ICD-10-CM

## 2015-05-29 NOTE — Patient Instructions (Signed)
Pleural effusion has resolved  Follow up with Dr. Vassie LollAlva  As needed

## 2015-05-31 NOTE — Progress Notes (Signed)
Subjective:    Patient ID: Ryan KaplanRobert Leach, male    DOB: [birth year], 30 y.o.   MRN: [id number]  HPI 30 year old never smoker with no significant PMH presented 04/14/15  with SOB, chest pain, fevers, chills and cough x 6 days & RUQ pain sharp/ pleuritic . Dx w/ CAP and Pleural Effusion   PCCM consulted for Small Rt sided exudative effusion   Significant tests/ events  11/22 /16 600cc reddish fluid - mononuclear exudate but not complicated. Cytology neg 11/19 CT chest - ruled out big PE + motion artifact  05/28/14 Follow up : Plueral effusion /CAP  Pt returns for a 6 week follow up for right pleural effusion .  Admitted in November 2016 with CAP and right sided pleural effusion .  He was treated with abx and underwent thoracentesis w/ 600cc fluid removed.  He is feeling good and back to baseline .  CXR today shows resolution of pleural effusion  He denies chest pain, orthopnea, edema or fever.  Appetite is normal .     No past medical history on file. Current Outpatient Prescriptions on File Prior to Visit  Medication Sig Dispense Refill  . albuterol (PROVENTIL) (2.5 MG/3ML) 0.083% nebulizer solution Take 3 mLs (2.5 mg total) by nebulization every 6 (six) hours as needed for wheezing or shortness of breath. 75 mL 1  . guaiFENesin (MUCINEX) 600 MG 12 hr tablet Take 2 tablets (1,200 mg total) by mouth 2 (two) times daily. 60 tablet 0  . Ibuprofen-Diphenhydramine HCl (ADVIL PM) 200-25 MG CAPS Take 2 capsules by mouth at bedtime as needed (pain.).    Marland Kitchen. loratadine (CLARITIN) 10 MG tablet Take 1 tablet (10 mg total) by mouth daily. 30 tablet 0  . oxyCODONE (OXY IR/ROXICODONE) 5 MG immediate release tablet Take 1 tablet (5 mg total) by mouth every 4 (four) hours as needed for moderate pain. 45 tablet 0  . pantoprazole (PROTONIX) 40 MG tablet Take 1 tablet (40 mg total) by mouth daily at 6 (six) AM. 30 tablet 0   No current facility-administered medications on file prior to visit.      Review of Systems Constitutional:   No  weight loss, night sweats,  Fevers, chills, fatigue, or  lassitude.  HEENT:   No headaches,  Difficulty swallowing,  Tooth/dental problems, or  Sore throat,                No sneezing, itching, ear ache, nasal congestion, post nasal drip,   CV:  No chest pain,  Orthopnea, PND, swelling in lower extremities, anasarca, dizziness, palpitations, syncope.   GI  No heartburn, indigestion, abdominal pain, nausea, vomiting, diarrhea, change in bowel habits, loss of appetite, bloody stools.   Resp: No shortness of breath with exertion or at rest.  No excess mucus, no productive cough,  No non-productive cough,  No coughing up of blood.  No change in color of mucus.  No wheezing.  No chest wall deformity  Skin: no rash or lesions.  GU: no dysuria, change in color of urine, no urgency or frequency.  No flank pain, no hematuria   MS:  No joint pain or swelling.  No decreased range of motion.  No back pain.  Psych:  No change in mood or affect. No depression or anxiety.  No memory loss.         Objective:   Physical Exam   Filed Vitals:   05/29/15 1644  BP: 138/80  Pulse: 104  Temp: 98  F (36.7 C)  TempSrc: Oral  Height: 5\' 9"  (1.753 m)  Weight: 237 lb (107.502 kg)  SpO2: 95%    GEN: A/Ox3; pleasant , NAD, well nourished   HEENT:  [place]/AT,  EACs-clear, TMs-wnl, NOSE-clear, THROAT-clear, no lesions, no postnasal drip or exudate noted.   NECK:  Supple w/ fair ROM; no JVD; normal carotid impulses w/o bruits; no thyromegaly or nodules palpated; no lymphadenopathy.  RESP  Clear  P & A; w/o, wheezes/ rales/ or rhonchi.no accessory muscle use, no dullness to percussion  CARD:  RRR, no m/r/g  , no peripheral edema, pulses intact, no cyanosis or clubbing.  GI:   Soft & nt; nml bowel sounds; no organomegaly or masses detected.  Musco: Warm bil, no deformities or joint swelling noted.   Neuro: alert, no focal deficits noted.    Skin: Warm, no  lesions or rashes   CXR 05/29/15 -reviewed independently  Right pleural effusion has resolved.       Assessment & Plan:

## 2015-06-04 NOTE — Assessment & Plan Note (Signed)
Resolved on chest xray  follow up with Pulmonary As needed

## 2015-07-09 ENCOUNTER — Ambulatory Visit: Payer: BLUE CROSS/BLUE SHIELD | Admitting: Internal Medicine

## 2016-03-12 IMAGING — US US ABDOMEN COMPLETE
1 series · 14 of 25 positions shown · non-contrast
Comparison: None.

CLINICAL DATA: Patient with right upper quadrant pain for 6 days.

EXAM:
ULTRASOUND ABDOMEN COMPLETE

[Series 1: us abdomen complete · 0.25mm/px · 14 of 61 slices shown]
[im 1/61]
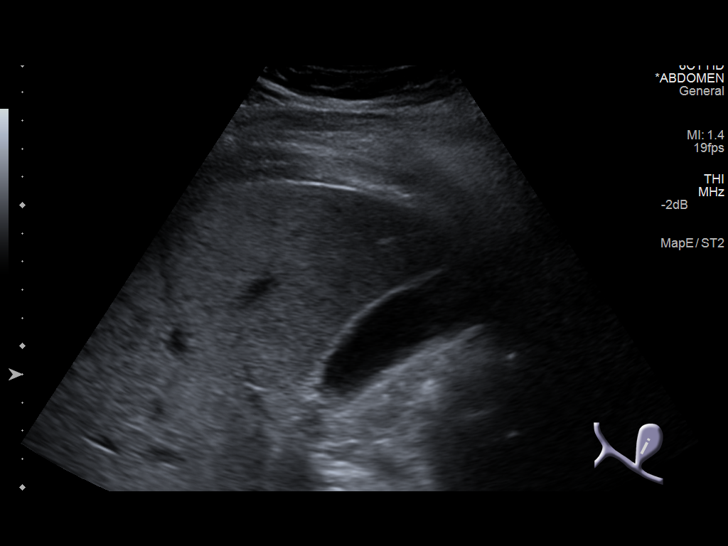
[im 6/61]
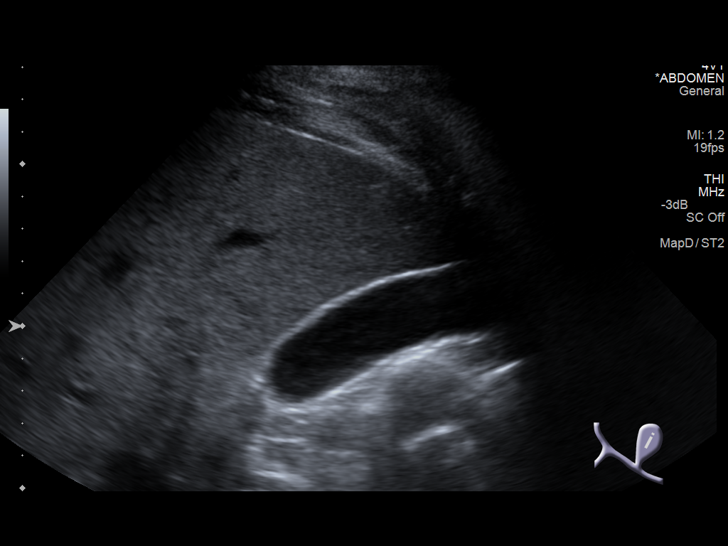
[im 11/61]
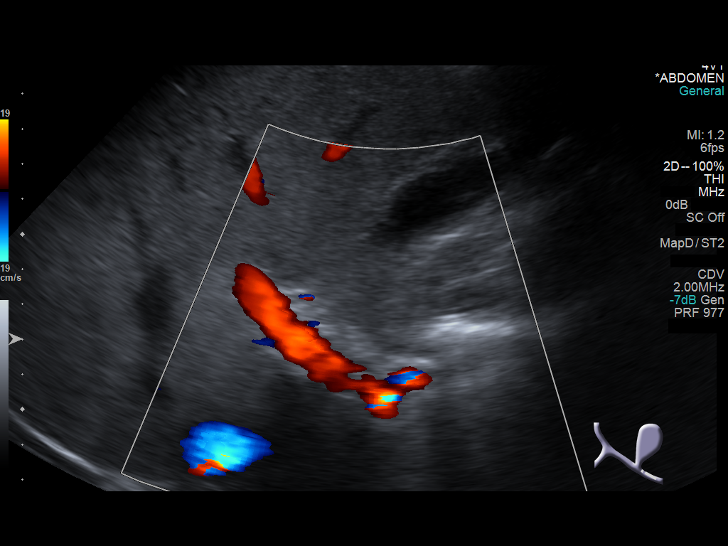
[im 16/61]
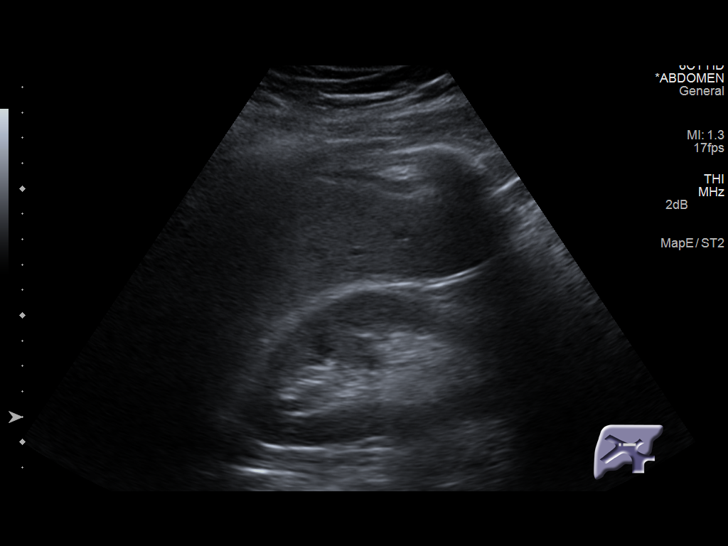
[im 21/61]
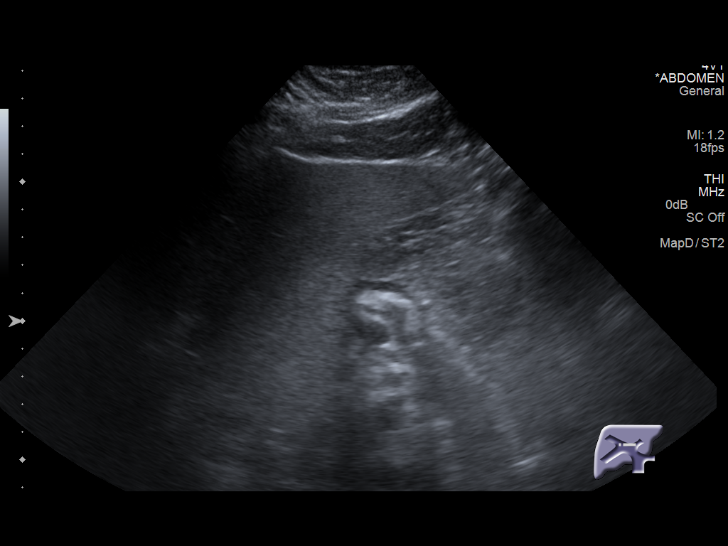
[im 23/61]
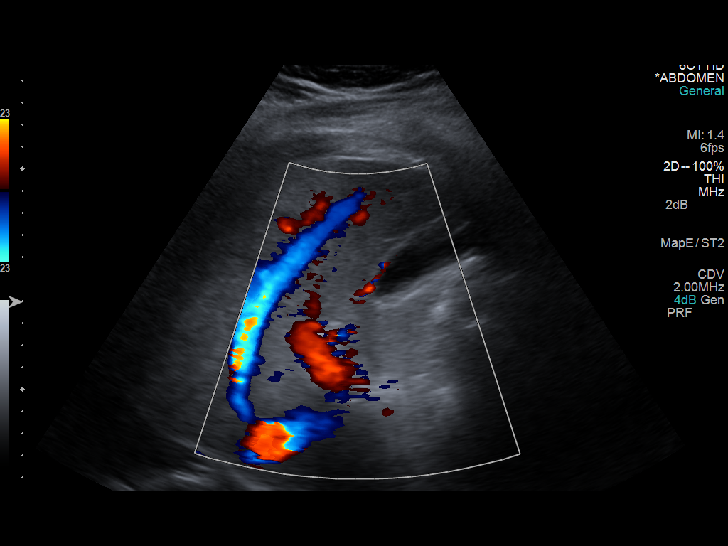
[im 28/61]
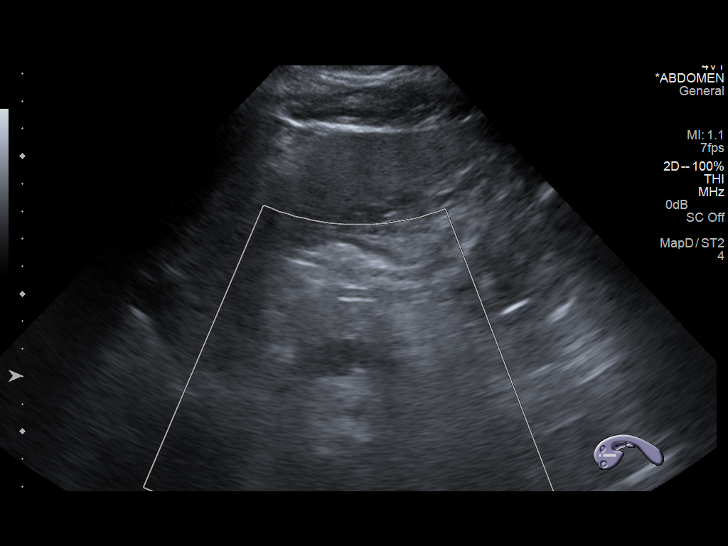
[im 33/61]
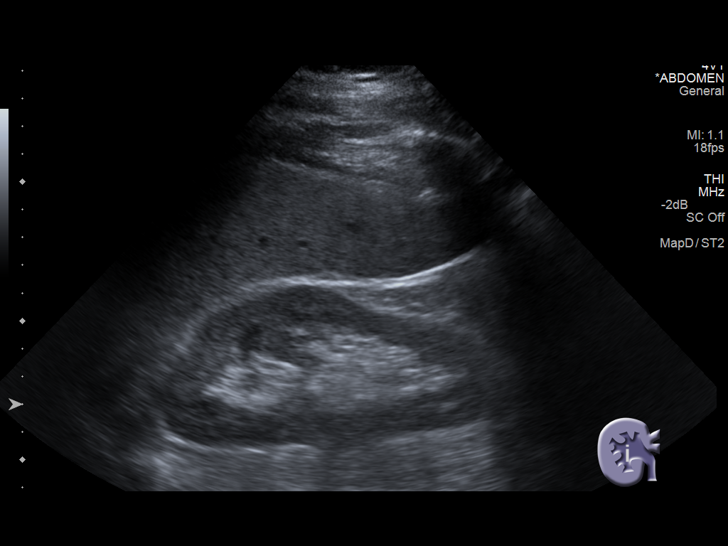
[im 38/61]
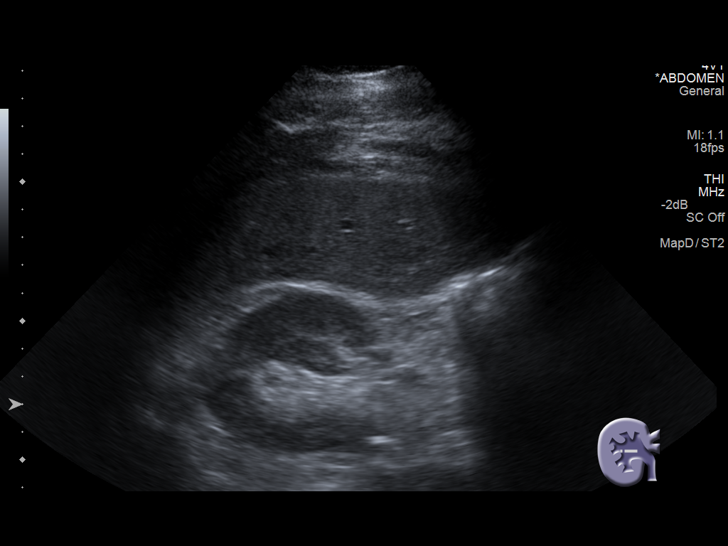
[im 41/61]
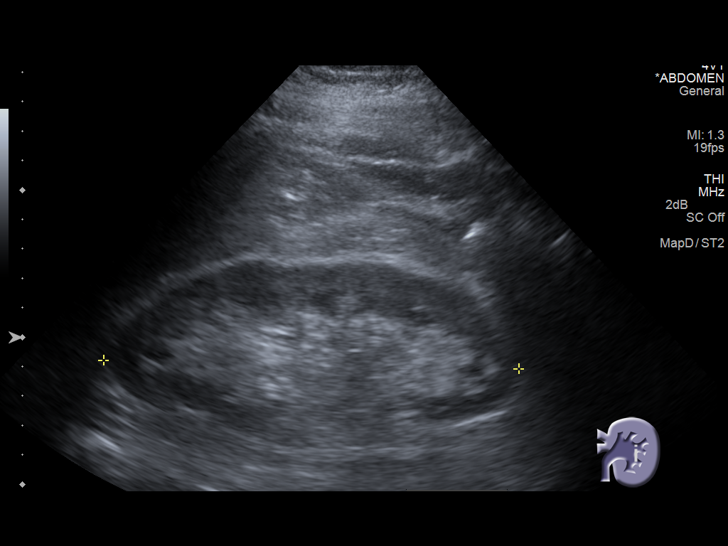
[im 46/61]
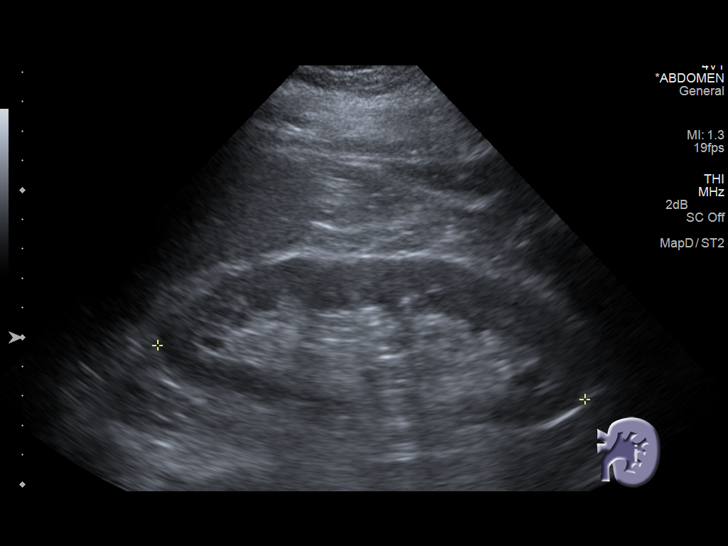
[im 51/61]
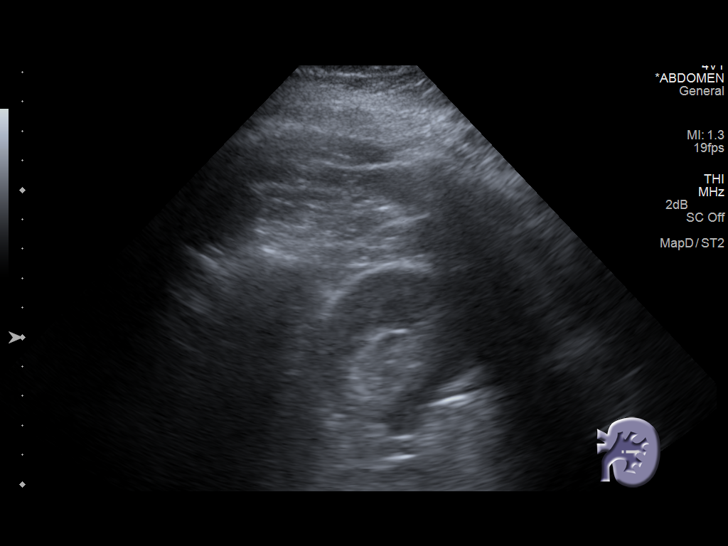
[im 56/61]
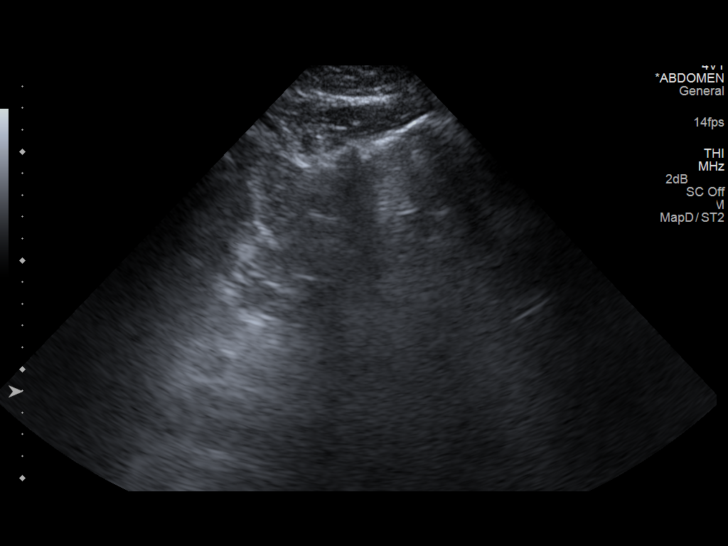
[im 61/61]
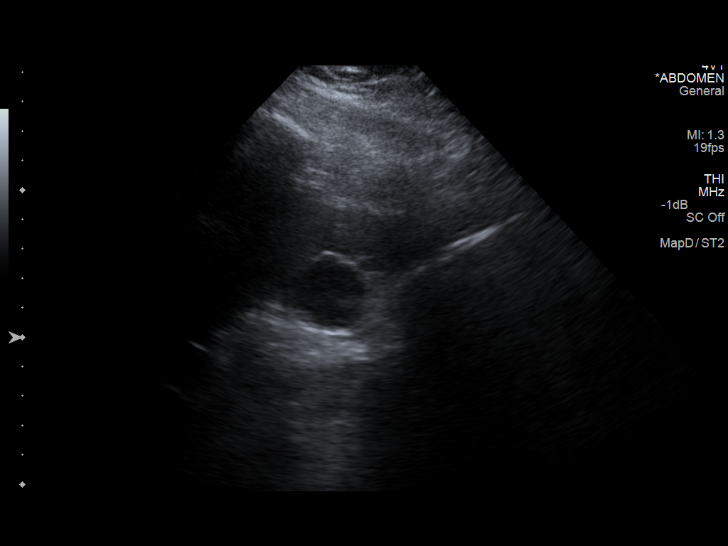

[14 of 25 positions shown; findings below may reference images not displayed]

FINDINGS: Gallbladder: No gallstones or wall thickening visualized. No
sonographic Murphy sign noted.

Common bile duct: Diameter: 3 mm

Liver: No focal lesion identified. Within normal limits in
parenchymal echogenicity.

IVC: No abnormality visualized.

Pancreas: Visualized portion unremarkable.

Spleen: Size and appearance within normal limits.

Right Kidney: Length: 12.3 cm. Echogenicity within normal limits. No
mass or hydronephrosis visualized.

Left Kidney: Length: 14.1 cm. Echogenicity within normal limits. No
mass or hydronephrosis visualized.

Abdominal aorta: Not visualized due to overlying bowel gas.

Other findings: None.
IMPRESSION: No cholelithiasis or sonographic evidence for acute cholecystitis.

No hydronephrosis.

## 2016-04-25 ENCOUNTER — Ambulatory Visit: Payer: BLUE CROSS/BLUE SHIELD | Admitting: Adult Health

## 2017-01-06 IMAGING — US US THORACENTESIS ASP PLEURAL SPACE W/IMG GUIDE
1 series · 4 of 4 positions shown · non-contrast
Comparison: None.

MEDICATIONS:
None

COMPLICATIONS:
None immediate

INDICATION: Pneumonia, dyspnea, right pleuritic chest discomfort, right pleural
effusion. Request is made for diagnostic and therapeutic right
thoracentesis.

EXAM:
ULTRASOUND GUIDED DIAGNOSTIC AND THERAPEUTIC RIGHT THORACENTESIS
TECHNIQUE: Informed written consent was obtained from the patient after a
discussion of the risks, benefits and alternatives to treatment. A
timeout was performed prior to the initiation of the procedure.

[Series 1: us thoracentesis asp pleural space w/img guide · 0.28mm/px · 4 of 4 slices shown]
[im 1/4]
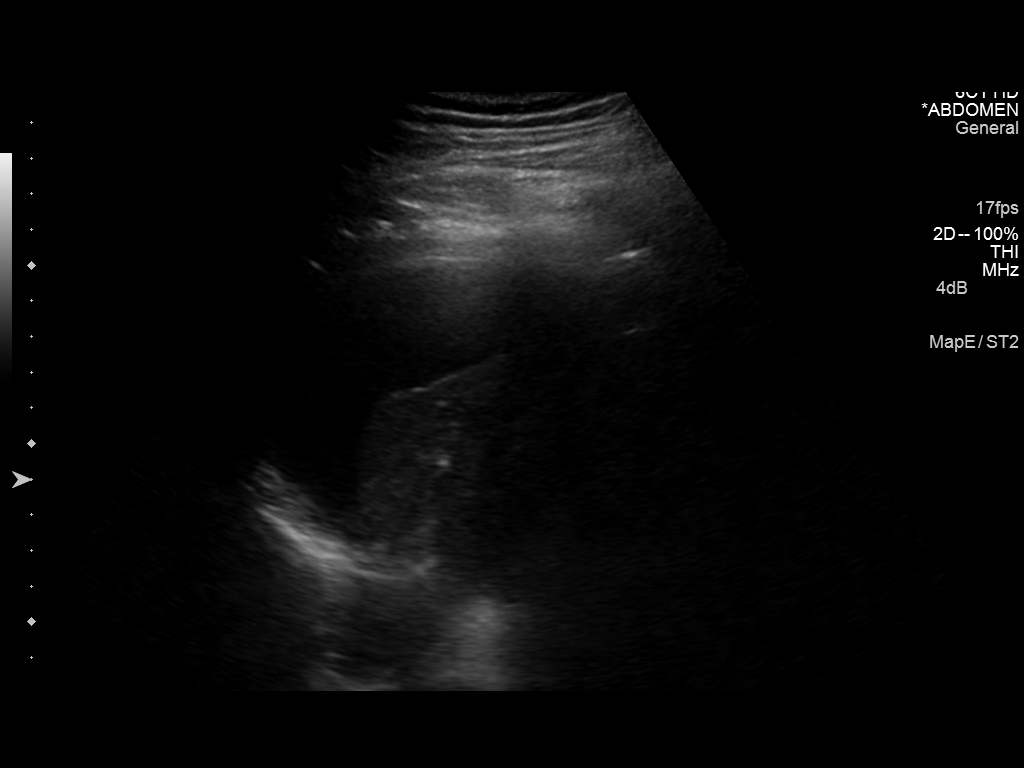
[im 2/4]
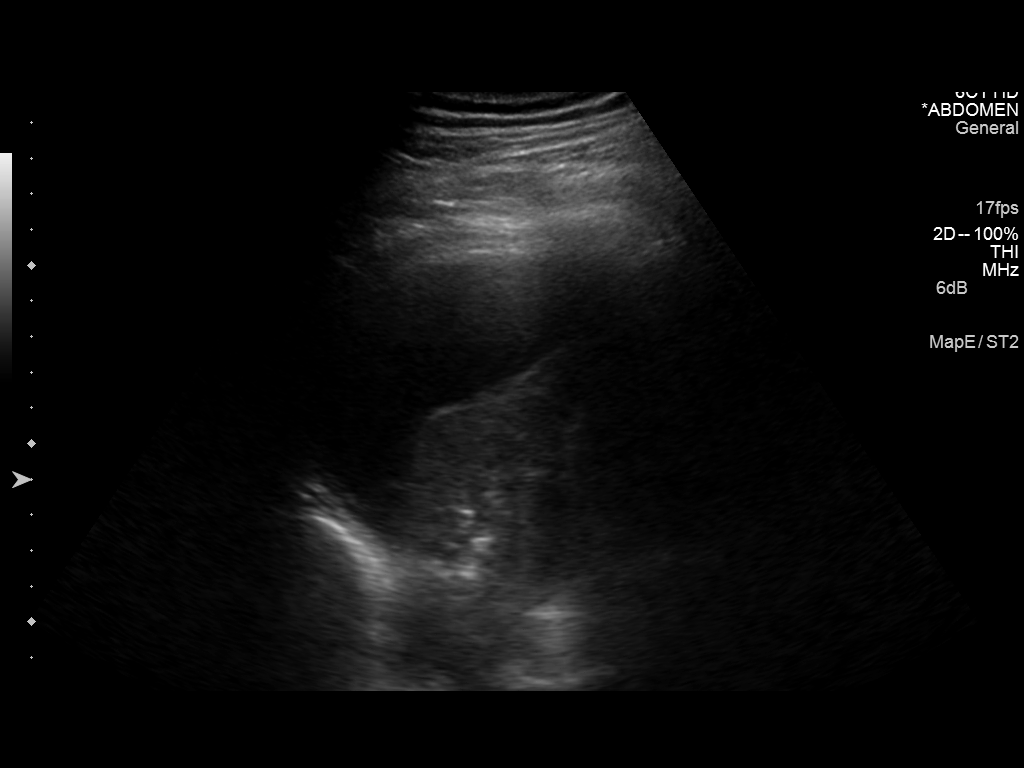
[im 3/4]
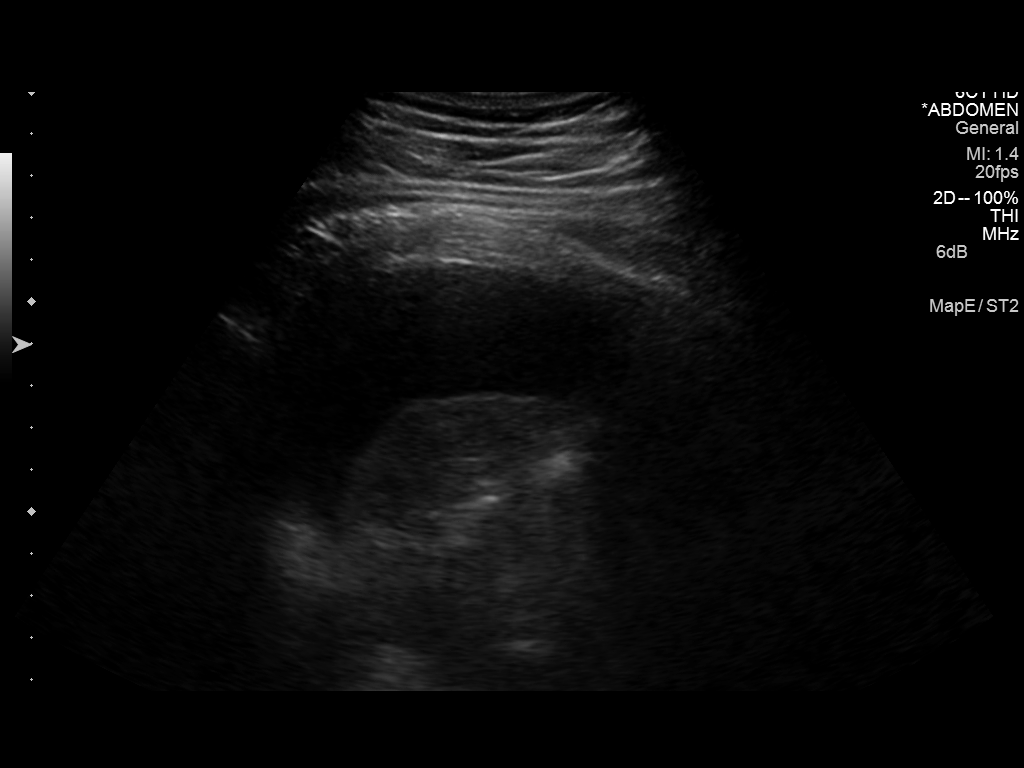
[im 4/4]
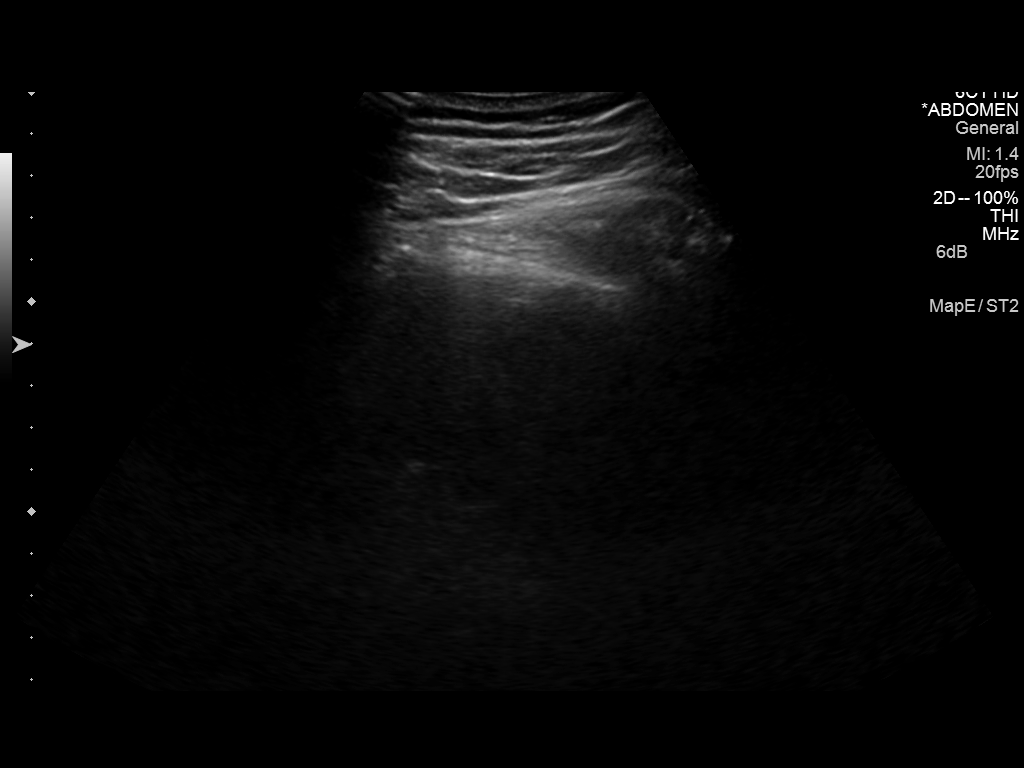

[4 of 4 positions shown; findings below may reference images not displayed]

Initial ultrasound scanning demonstrates a small to moderate right
pleural effusion. The lower chest was prepped and draped in the
usual sterile fashion. 1% lidocaine was used for local anesthesia.

An ultrasound image was saved for documentation purposes. A 6 Fr
Safe-T-Centesis catheter was introduced. The thoracentesis was
performed. The catheter was removed and a dressing was applied. The
patient tolerated the procedure well without immediate post
procedural complication. The patient was escorted to have an upright
chest radiograph.
FINDINGS: A total of approximately 600 cc of turbid, blood-tinged fluid was
removed. Requested samples were sent to the laboratory.
IMPRESSION: Successful ultrasound-guided diagnostic and therapeutic right sided
thoracentesis yielding 600 cc of pleural fluid.

## 2017-01-06 IMAGING — DX DG CHEST 1V
1 series · 1 of 1 positions shown · non-contrast
Comparison: Study obtained earlier in the day

CLINICAL DATA: Status post right-sided thoracentesis

EXAM:
CHEST 1 VIEW

[chest pa]
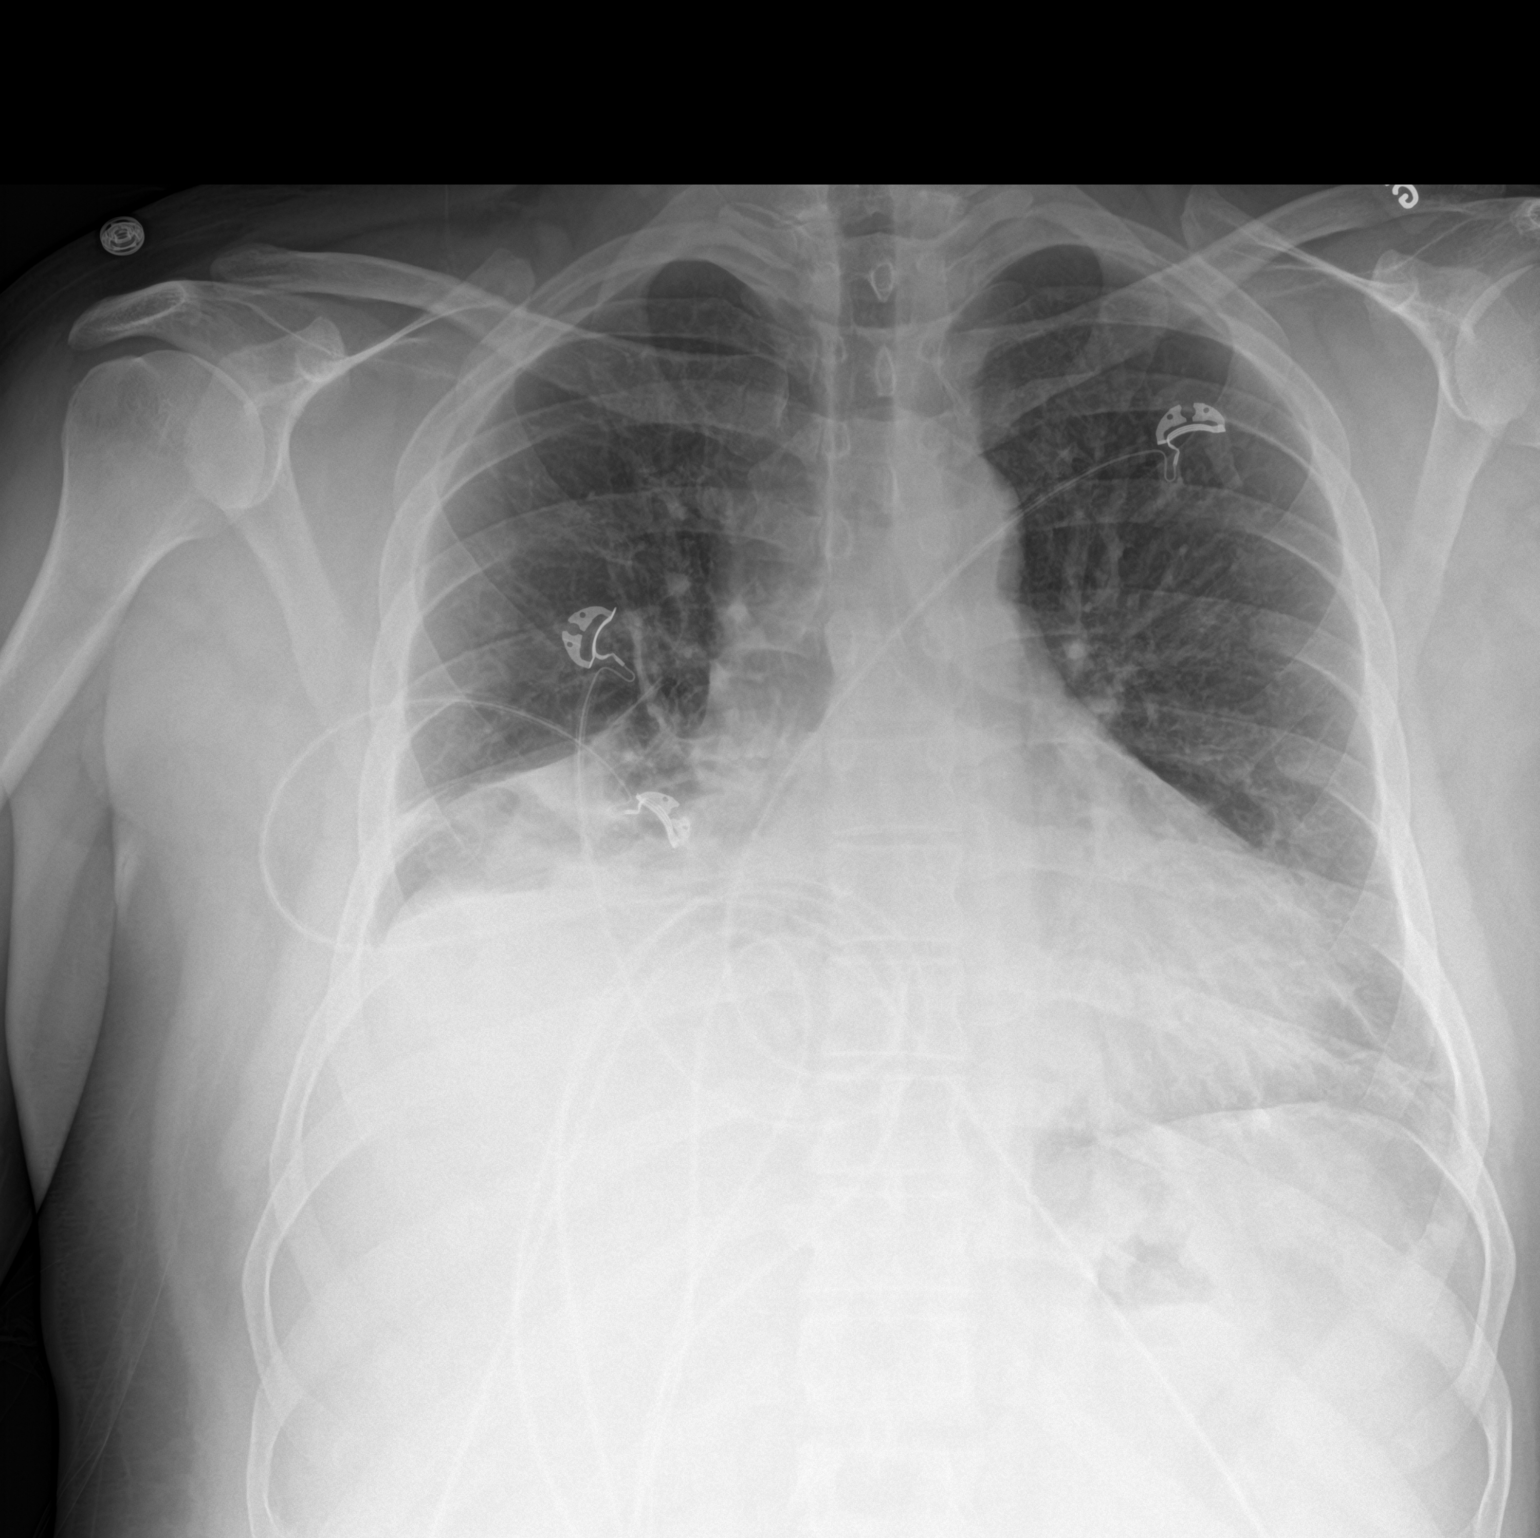

[1 of 1 positions shown; findings below may reference images not displayed]

FINDINGS: Right effusion is considerably smaller following thoracentesis. No
pneumothorax. There is a new small residual right effusion with
patchy consolidation in the right base. Left lung is clear except
for mild lateral left base atelectasis. Heart is borderline
prominent with pulmonary vascular within normal limits. No
adenopathy. No bone lesions.
IMPRESSION: No pneumothorax. Small residual right effusion with right base
atelectasis/consolidation. Minimal left base atelectasis. No new
opacity. No change in cardiac silhouette.

## 2017-01-06 IMAGING — DX DG CHEST 1V PORT
1 series · 1 of 1 positions shown · non-contrast
Comparison: 04/16/2015.

CLINICAL DATA: Right pleural effusion.

EXAM:
PORTABLE CHEST 1 VIEW

[chest ap]
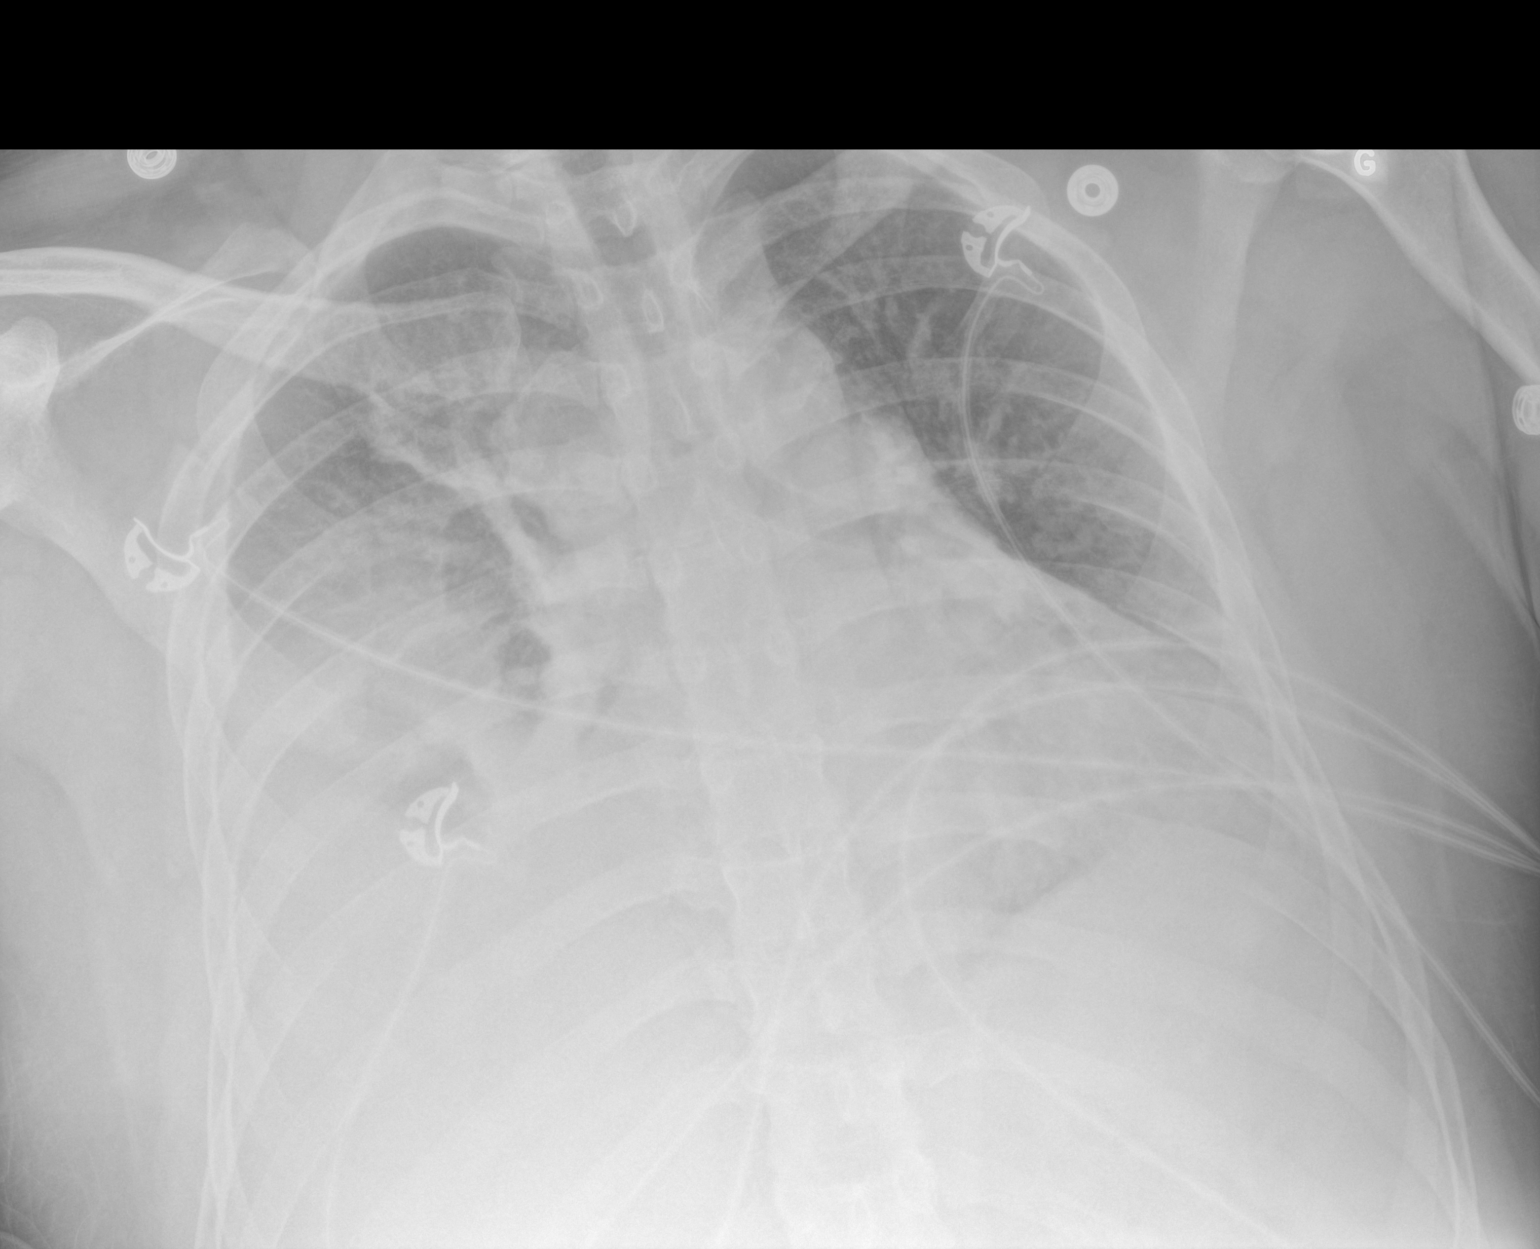

[1 of 1 positions shown; findings below may reference images not displayed]

FINDINGS: Mediastinum hilar structures are normal. Right perihilar/right lower
lobe atelectasis and/or infiltrate with right pleural effusion again
noted. Left lung is clear. Cardiomegaly with normal pulmonary
vascularity. No acute bony abnormality .
IMPRESSION: 1. Persistent right perihilar/right lower lobe atelectasis and/or
infiltrate and right pleural effusion.
2. Persistent cardiomegaly.  No pulmonary venous congestion.

## 2017-01-07 IMAGING — CR DG CHEST 2V
2 series · 2 of 2 positions shown · non-contrast
Comparison: 04/17/2015

CLINICAL DATA: Followup pleural effusion.

EXAM:
CHEST  2 VIEW

[w chest pa]
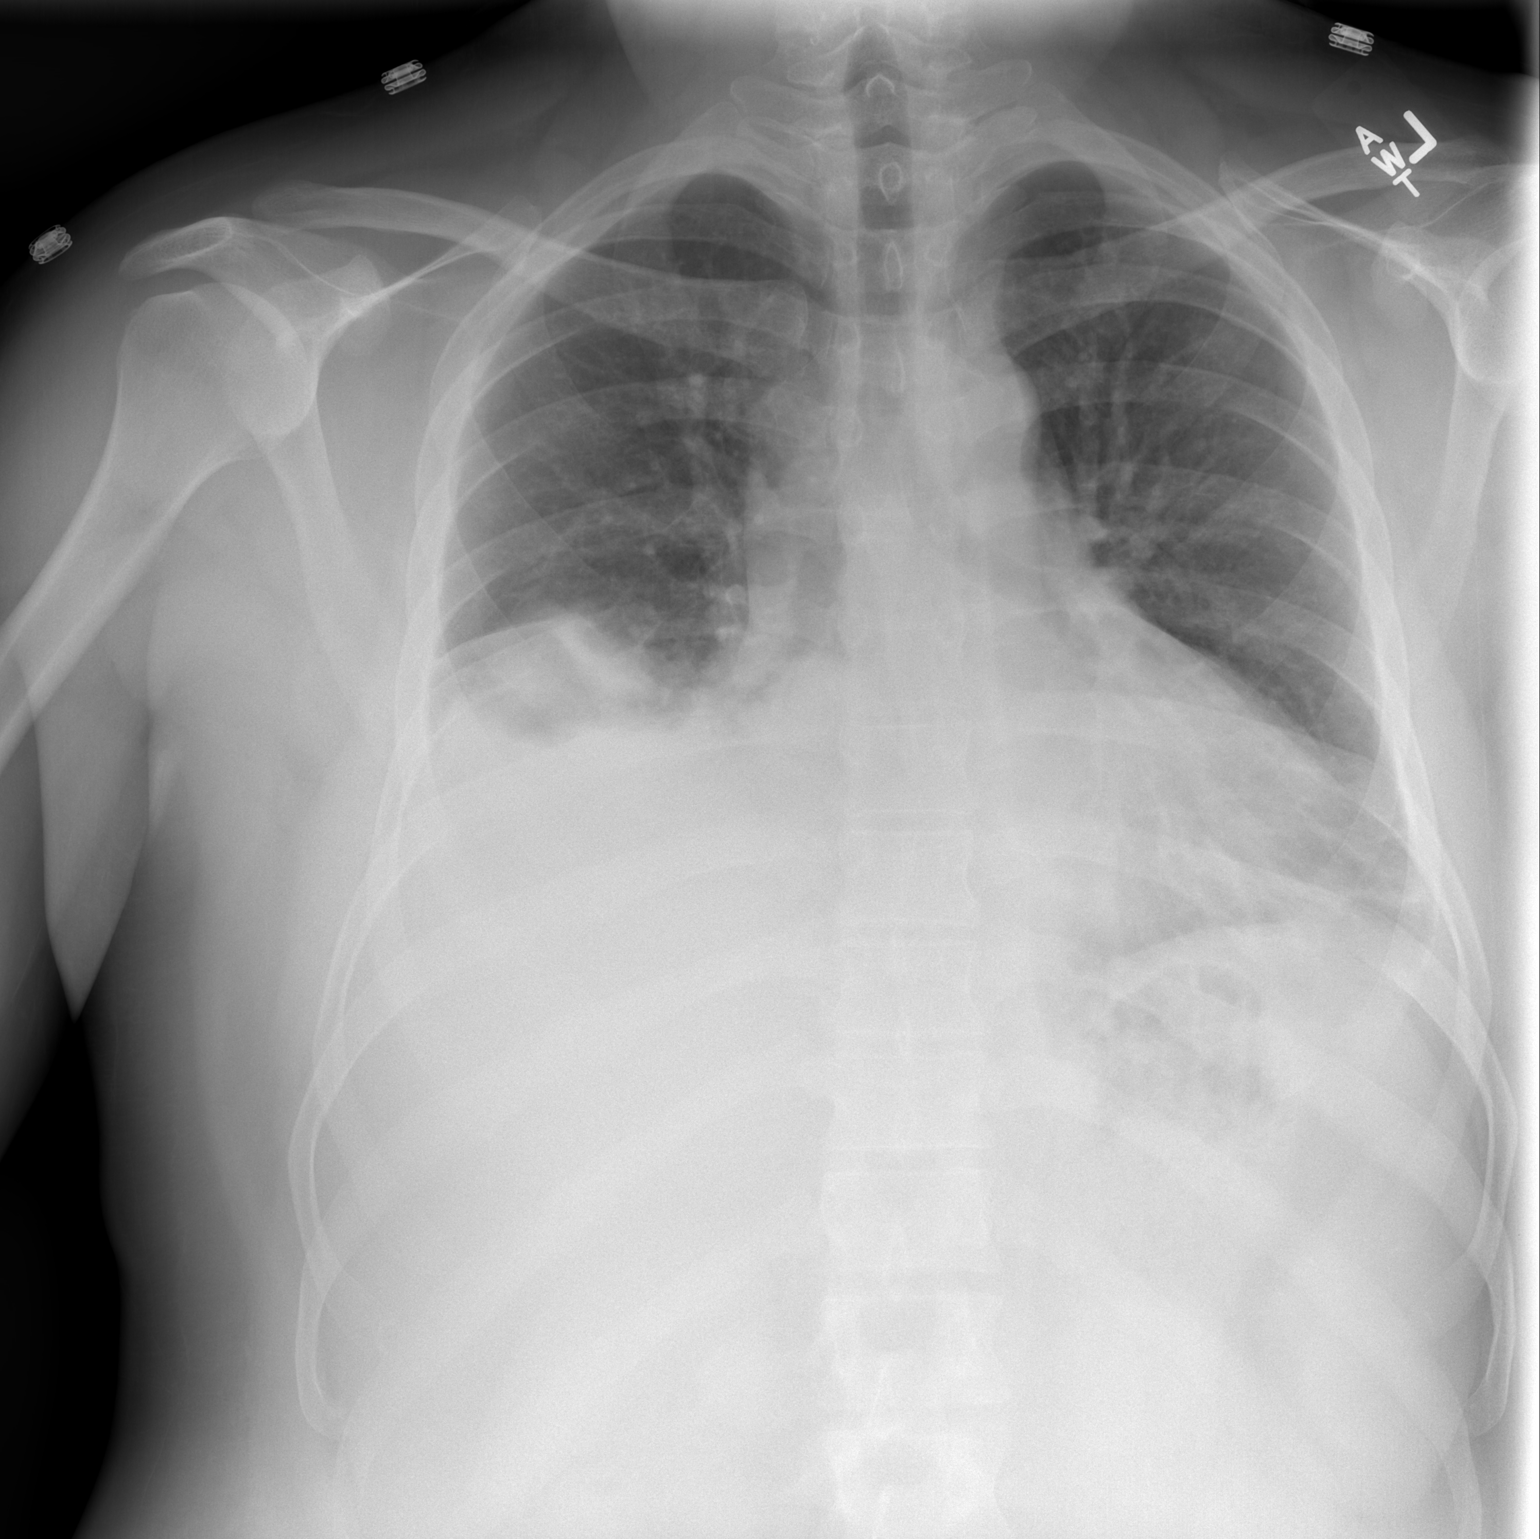

[w chest lat]
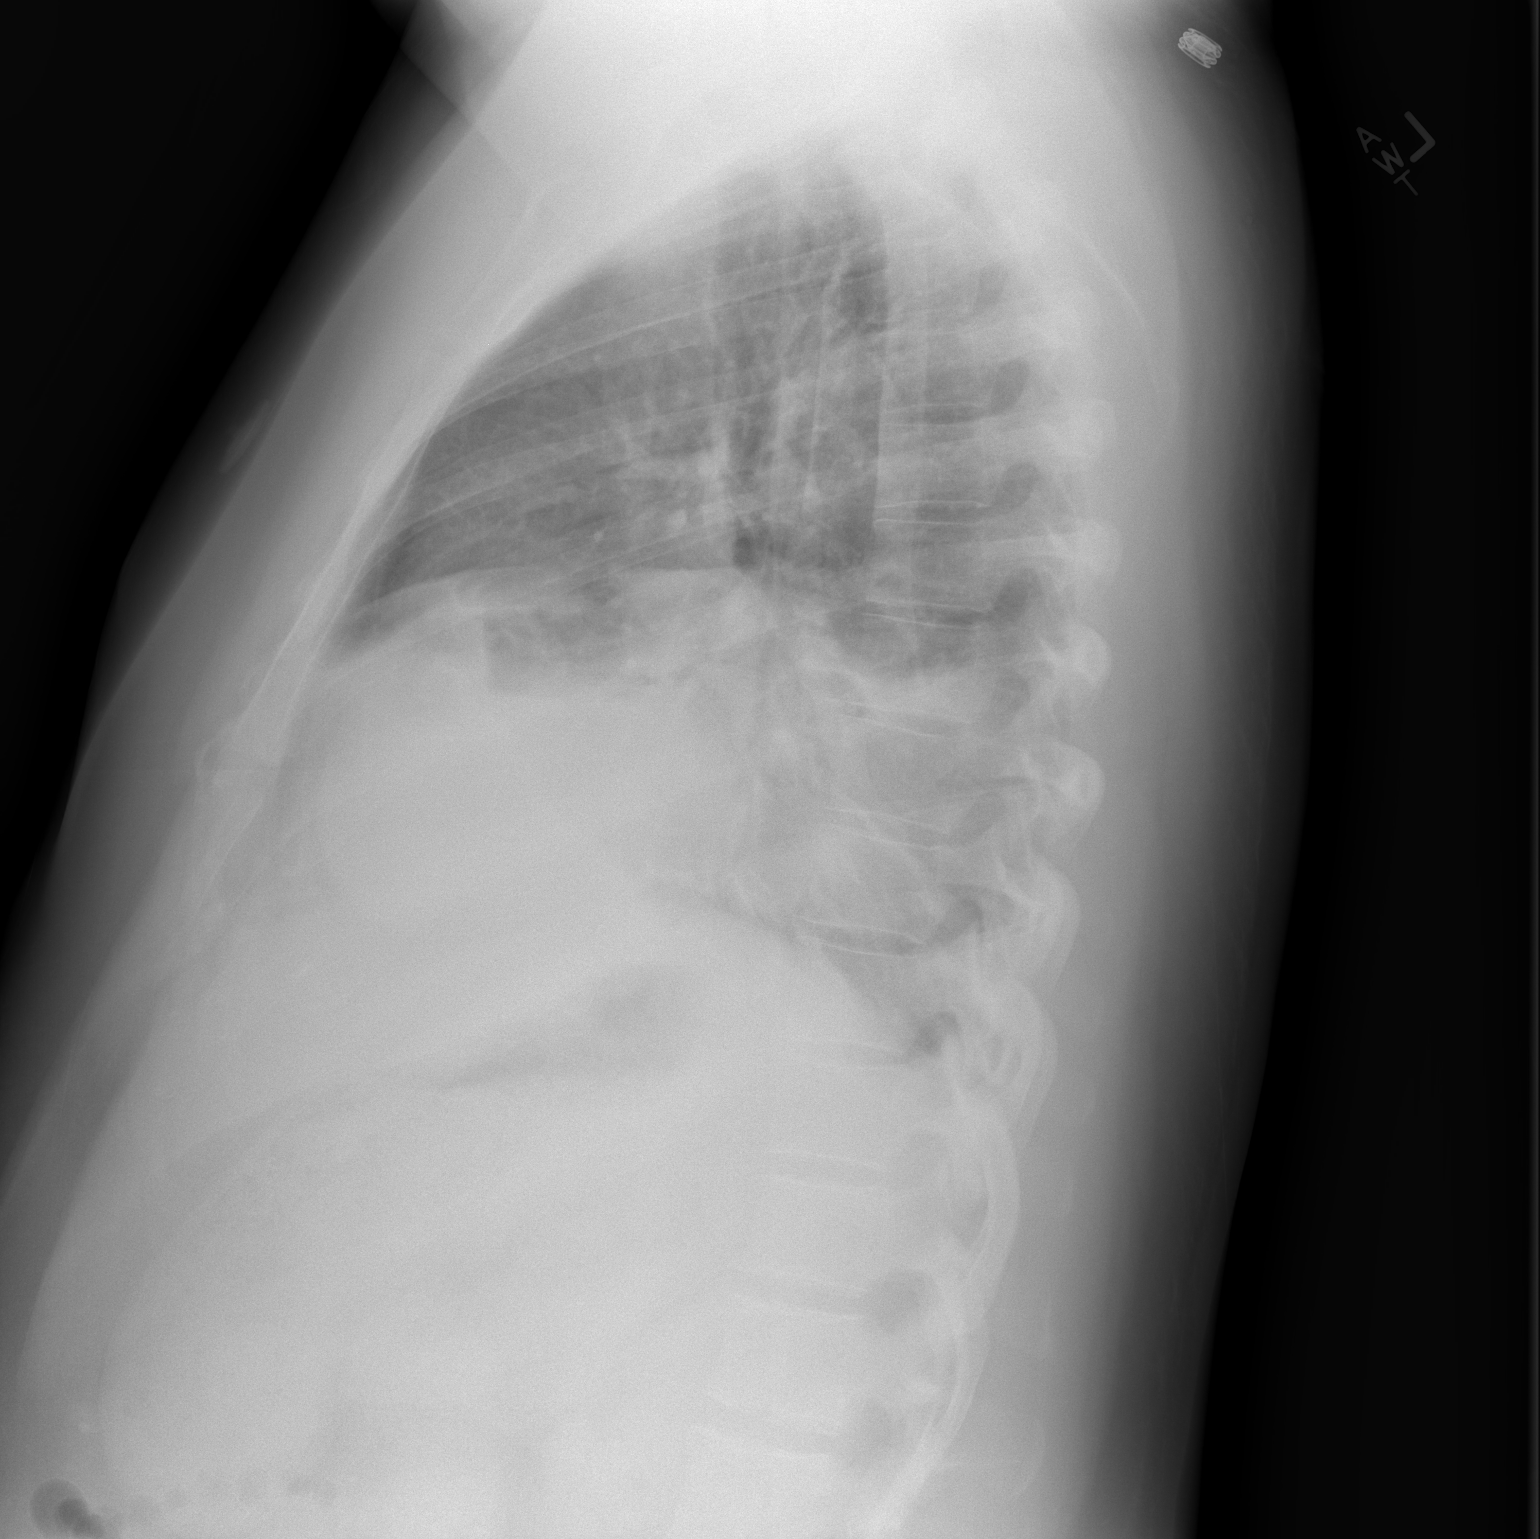

[2 of 2 positions shown; findings below may reference images not displayed]

FINDINGS: The heart size is mildly enlarged. There is atelectasis in the left
base. There has been partial reaccumulation of moderate right
pleural effusion with overlying atelectasis. No pneumothorax.
IMPRESSION: 1. Partial reaccumulation of right pleural effusion status post
thoracentesis.

## 2018-06-16 DIAGNOSIS — Z0000 Encounter for general adult medical examination without abnormal findings: Secondary | ICD-10-CM | POA: Diagnosis not present

## 2018-06-16 DIAGNOSIS — R7301 Impaired fasting glucose: Secondary | ICD-10-CM | POA: Diagnosis not present

## 2018-06-16 DIAGNOSIS — R82998 Other abnormal findings in urine: Secondary | ICD-10-CM | POA: Diagnosis not present

## 2018-06-23 DIAGNOSIS — R7301 Impaired fasting glucose: Secondary | ICD-10-CM | POA: Diagnosis not present

## 2018-06-23 DIAGNOSIS — Z0000 Encounter for general adult medical examination without abnormal findings: Secondary | ICD-10-CM | POA: Diagnosis not present

## 2018-06-23 DIAGNOSIS — E781 Pure hyperglyceridemia: Secondary | ICD-10-CM | POA: Diagnosis not present

## 2018-06-23 DIAGNOSIS — R809 Proteinuria, unspecified: Secondary | ICD-10-CM | POA: Diagnosis not present

## 2018-06-23 DIAGNOSIS — Z23 Encounter for immunization: Secondary | ICD-10-CM | POA: Diagnosis not present

## 2018-06-23 DIAGNOSIS — R808 Other proteinuria: Secondary | ICD-10-CM | POA: Diagnosis not present
